# Patient Record
Sex: Male | Born: 1960
Health system: Southern US, Community
[De-identification: ages and names within clinical notes are randomized; demographics above are authoritative.]

## PROBLEM LIST (undated history)

## (undated) DIAGNOSIS — J339 Nasal polyp, unspecified: Secondary | ICD-10-CM

## (undated) DIAGNOSIS — N529 Male erectile dysfunction, unspecified: Secondary | ICD-10-CM

## (undated) DIAGNOSIS — I499 Cardiac arrhythmia, unspecified: Secondary | ICD-10-CM

## (undated) DIAGNOSIS — K219 Gastro-esophageal reflux disease without esophagitis: Secondary | ICD-10-CM

## (undated) DIAGNOSIS — J342 Deviated nasal septum: Secondary | ICD-10-CM

## (undated) DIAGNOSIS — Z86010 Personal history of colonic polyps: Secondary | ICD-10-CM

## (undated) DIAGNOSIS — I509 Heart failure, unspecified: Secondary | ICD-10-CM

## (undated) DIAGNOSIS — J329 Chronic sinusitis, unspecified: Secondary | ICD-10-CM

## (undated) DIAGNOSIS — I471 Supraventricular tachycardia: Secondary | ICD-10-CM

## (undated) DIAGNOSIS — T7840XA Allergy, unspecified, initial encounter: Secondary | ICD-10-CM

## (undated) DIAGNOSIS — E785 Hyperlipidemia, unspecified: Secondary | ICD-10-CM

## (undated) DIAGNOSIS — M1990 Unspecified osteoarthritis, unspecified site: Secondary | ICD-10-CM

## (undated) HISTORY — DX: Hyperlipidemia, unspecified: E78.5

## (undated) HISTORY — DX: Allergy, unspecified, initial encounter: T78.40XA

## (undated) HISTORY — PX: COLONOSCOPY: SHX174

## (undated) HISTORY — PX: OTHER SURGICAL HISTORY: SHX169

## (undated) HISTORY — DX: Supraventricular tachycardia: I47.1

## (undated) HISTORY — DX: Nasal polyp, unspecified: J33.9

## (undated) HISTORY — DX: Heart failure, unspecified: I50.9

## (undated) HISTORY — DX: Male erectile dysfunction, unspecified: N52.9

## (undated) HISTORY — PX: POLYPECTOMY: SHX149

## (undated) HISTORY — PX: COLONOSCOPY W/ BIOPSIES: SHX1374

## (undated) HISTORY — DX: Personal history of colonic polyps: Z86.010

## (undated) HISTORY — PX: UPPER GASTROINTESTINAL ENDOSCOPY: SHX188

## (undated) HISTORY — PX: SKIN GRAFT: SHX250

---

## 1997-09-04 DIAGNOSIS — I471 Supraventricular tachycardia, unspecified: Secondary | ICD-10-CM

## 1997-09-04 HISTORY — DX: Supraventricular tachycardia: I47.1

## 1997-09-04 HISTORY — PX: HAND SURGERY: SHX662

## 1997-09-04 HISTORY — DX: Supraventricular tachycardia, unspecified: I47.10

## 1998-01-14 ENCOUNTER — Inpatient Hospital Stay (HOSPITAL_COMMUNITY): Admission: AD | Admit: 1998-01-14 | Discharge: 1998-01-18 | Payer: Self-pay | Admitting: *Deleted

## 1998-01-28 ENCOUNTER — Emergency Department (HOSPITAL_COMMUNITY): Admission: EM | Admit: 1998-01-28 | Discharge: 1998-01-28 | Payer: Self-pay | Admitting: Emergency Medicine

## 2006-01-21 ENCOUNTER — Emergency Department (HOSPITAL_COMMUNITY): Admission: EM | Admit: 2006-01-21 | Discharge: 2006-01-21 | Payer: Self-pay | Admitting: Family Medicine

## 2007-06-28 ENCOUNTER — Ambulatory Visit (HOSPITAL_COMMUNITY): Admission: RE | Admit: 2007-06-28 | Discharge: 2007-06-28 | Payer: Self-pay | Admitting: Family Medicine

## 2009-12-02 ENCOUNTER — Encounter: Admission: RE | Admit: 2009-12-02 | Discharge: 2009-12-02 | Payer: Self-pay | Admitting: Obstetrics and Gynecology

## 2012-12-17 ENCOUNTER — Other Ambulatory Visit: Payer: Self-pay | Admitting: *Deleted

## 2012-12-17 ENCOUNTER — Telehealth: Payer: Self-pay | Admitting: Nurse Practitioner

## 2012-12-17 NOTE — Telephone Encounter (Signed)
RX WILL BE ESCRIBED BACK TODAY WITH DENIAL. NOT SEEN SINCE 1/13

## 2012-12-27 ENCOUNTER — Telehealth: Payer: Self-pay | Admitting: Nurse Practitioner

## 2012-12-28 NOTE — Telephone Encounter (Signed)
Denied, LMTCB, must be seen

## 2013-01-08 ENCOUNTER — Ambulatory Visit (INDEPENDENT_AMBULATORY_CARE_PROVIDER_SITE_OTHER): Payer: 59 | Admitting: Nurse Practitioner

## 2013-01-08 ENCOUNTER — Encounter: Payer: Self-pay | Admitting: Nurse Practitioner

## 2013-01-08 VITALS — BP 109/71 | HR 69 | Temp 97.5°F | Ht 65.5 in | Wt 214.0 lb

## 2013-01-08 DIAGNOSIS — J309 Allergic rhinitis, unspecified: Secondary | ICD-10-CM | POA: Insufficient documentation

## 2013-01-08 DIAGNOSIS — Z0000 Encounter for general adult medical examination without abnormal findings: Secondary | ICD-10-CM

## 2013-01-08 LAB — COMPLETE METABOLIC PANEL WITH GFR
Albumin: 4.1 g/dL (ref 3.5–5.2)
Chloride: 105 mEq/L (ref 96–112)
GFR, Est African American: >89 mL/min
Potassium: 4.2 mEq/L (ref 3.5–5.3)
Total Bilirubin: 0.4 mg/dL (ref 0.3–1.2)
Total Protein: 6.9 g/dL (ref 6.0–8.3)

## 2013-01-08 LAB — POCT CBC
HCT, POC: 43.1 % — AB (ref 43.5–53.7)
Hemoglobin: 14.4 g/dL (ref 14.1–18.1)
Lymph, poc: 2.6 (ref 0.6–3.4)
MCHC: 33.4 g/dL (ref 31.8–35.4)
MPV: 6.8 fL (ref 0–99.8)
POC Granulocyte: 3 (ref 2–6.9)
POC LYMPH PERCENT: 43.1 %L (ref 10–50)
RBC: 4.9 M/uL (ref 4.69–6.13)
WBC: 6.1 10*3/uL (ref 4.6–10.2)

## 2013-01-08 NOTE — Patient Instructions (Signed)
Health Maintenance, Males A healthy lifestyle and preventative care can promote health and wellness.  Maintain regular health, dental, and eye exams.  Eat a healthy diet. Foods like vegetables, fruits, whole grains, low-fat dairy products, and lean protein foods contain the nutrients you need without too many calories. Decrease your intake of foods high in solid fats, added sugars, and salt. Get information about a proper diet from your caregiver, if necessary.  Regular physical exercise is one of the most important things you can do for your health. Most adults should get at least 150 minutes of moderate-intensity exercise (any activity that increases your heart rate and causes you to sweat) each week. In addition, most adults need muscle-strengthening exercises on 2 or more days a week.   Maintain a healthy weight. The body mass index (BMI) is a screening tool to identify possible weight problems. It provides an estimate of body fat based on height and weight. Your caregiver can help determine your BMI, and can help you achieve or maintain a healthy weight. For adults 20 years and older:  A BMI below 18.5 is considered underweight.  A BMI of 18.5 to 24.9 is normal.  A BMI of 25 to 29.9 is considered overweight.  A BMI of 30 and above is considered obese.  Maintain normal blood lipids and cholesterol by exercising and minimizing your intake of saturated fat. Eat a balanced diet with plenty of fruits and vegetables. Blood tests for lipids and cholesterol should begin at age 20 and be repeated every 5 years. If your lipid or cholesterol levels are high, you are over 50, or you are a high risk for heart disease, you may need your cholesterol levels checked more frequently.Ongoing high lipid and cholesterol levels should be treated with medicines, if diet and exercise are not effective.  If you smoke, find out from your caregiver how to quit. If you do not use tobacco, do not start.  If you  choose to drink alcohol, do not exceed 2 drinks per day. One drink is considered to be 12 ounces (355 mL) of beer, 5 ounces (148 mL) of wine, or 1.5 ounces (44 mL) of liquor.  Avoid use of street drugs. Do not share needles with anyone. Ask for help if you need support or instructions about stopping the use of drugs.  High blood pressure causes heart disease and increases the risk of stroke. Blood pressure should be checked at least every 1 to 2 years. Ongoing high blood pressure should be treated with medicines if weight loss and exercise are not effective.  If you are 45 to 50 years old, ask your caregiver if you should take aspirin to prevent heart disease.  Diabetes screening involves taking a blood sample to check your fasting blood sugar level. This should be done once every 3 years, after age 45, if you are within normal weight and without risk factors for diabetes. Testing should be considered at a younger age or be carried out more frequently if you are overweight and have at least 1 risk factor for diabetes.  Colorectal cancer can be detected and often prevented. Most routine colorectal cancer screening begins at the age of 50 and continues through age 75. However, your caregiver may recommend screening at an earlier age if you have risk factors for colon cancer. On a yearly basis, your caregiver may provide home test kits to check for hidden blood in the stool. Use of a small camera at the end of a tube,   to directly examine the colon (sigmoidoscopy or colonoscopy), can detect the earliest forms of colorectal cancer. Talk to your caregiver about this at age 50, when routine screening begins. Direct examination of the colon should be repeated every 5 to 10 years through age 75, unless early forms of pre-cancerous polyps or small growths are found.  Hepatitis C blood testing is recommended for all people born from 1945 through 1965 and any individual with known risks for hepatitis C.  Healthy  men should no longer receive prostate-specific antigen (PSA) blood tests as part of routine cancer screening. Consult with your caregiver about prostate cancer screening.  Testicular cancer screening is not recommended for adolescents or adult males who have no symptoms. Screening includes self-exam, caregiver exam, and other screening tests. Consult with your caregiver about any symptoms you have or any concerns you have about testicular cancer.  Practice safe sex. Use condoms and avoid high-risk sexual practices to reduce the spread of sexually transmitted infections (STIs).  Use sunscreen with a sun protection factor (SPF) of 30 or greater. Apply sunscreen liberally and repeatedly throughout the day. You should seek shade when your shadow is shorter than you. Protect yourself by wearing long sleeves, pants, a wide-brimmed hat, and sunglasses year round, whenever you are outdoors.  Notify your caregiver of new moles or changes in moles, especially if there is a change in shape or color. Also notify your caregiver if a mole is larger than the size of a pencil eraser.  A one-time screening for abdominal aortic aneurysm (AAA) and surgical repair of large AAAs by sound wave imaging (ultrasonography) is recommended for ages 65 to 75 years who are current or former smokers.  Stay current with your immunizations. Document Released: 02/17/2008 Document Revised: 11/13/2011 Document Reviewed: 01/16/2011 [employer] Patient Information 2013 [employer].  

## 2013-01-08 NOTE — Progress Notes (Signed)
  Subjective:    Patient ID: Thomas Gates, male    DOB: [birth year], 52 y.o.   MRN: [id number]  HPI- Patient here today for CPE- He has no medical problems other than allergic rhinitis- Patient saw ENT several weeks ago and had nasal polyps- Was started on nasal spray and is doing much better. Patient has no Complaints today. Allergies  Allergen Reactions  . Penicillins Rash    Outpatient Encounter Prescriptions as of 01/08/2013  Medication Sig Dispense Refill  . fexofenadine (ALLEGRA) 180 MG tablet Take 180 mg by mouth daily.       No facility-administered encounter medications on file as of 01/08/2013.    Past Medical History  Diagnosis Date  . ED (erectile dysfunction)   . Hyperlipidemia     Past Surgical History  Procedure Laterality Date  . Svt ablation    . Skin graft      right hand    History   Social History  . Marital Status: Married    Spouse Name: N/A    Number of Children: N/A  . Years of Education: N/A   Occupational History  . Not on file.   Social History Main Topics  . Smoking status: Never Smoker   . Smokeless tobacco: Not on file  . Alcohol Use: No  . Drug Use: No  . Sexually Active: Not on file   Other Topics Concern  . Not on file   Social History Narrative  . No narrative on file         Review of Systems  All other systems reviewed and are negative.       Objective:   Physical Exam  Constitutional: He is oriented to person, place, and time. He appears well-developed and well-nourished.  HENT:  Head: Normocephalic.  Right Ear: External ear normal.  Left Ear: External ear normal.  Nose: Nose normal.  Mouth/Throat: Oropharynx is clear and moist.  Eyes: EOM are normal. Pupils are equal, round, and reactive to light.  Neck: Normal range of motion. Neck supple. No thyromegaly present.  Cardiovascular: Normal rate, regular rhythm, normal heart sounds and intact distal pulses.   No murmur heard. Pulmonary/Chest: Effort normal  and breath sounds normal. He has no wheezes. He has no rales.  Abdominal: Soft. Bowel sounds are normal.  Genitourinary: Prostate normal and penis normal.  Negative Hemocult  Musculoskeletal: Normal range of motion.  Neurological: He is alert and oriented to person, place, and time.  Skin: Skin is warm and dry.  Psychiatric: He has a normal mood and affect. His behavior is normal. Judgment and thought content normal.   BP 109/71  Pulse 69  Temp(Src) 97.5 F (36.4 C) (Oral)  Ht 5' 5.5" (1.664 m)  Wt 214 lb (97.07 kg)  BMI 35.06 kg/m2       Assessment & Plan:  CPE  Low fat diet and exercise  Labs pending Mary-Margaret Daphine Deutscher, FNP

## 2013-01-10 LAB — NMR LIPOPROFILE WITH LIPIDS
Cholesterol, Total: 196 mg/dL (ref <200)
HDL Particle Number: 28.8 umol/L — ABNORMAL LOW (ref >=30.5)
HDL Size: 9.4 nm (ref >=9.2)
HDL-C: 51 mg/dL (ref >=40)
LDL (calc): 122 mg/dL — ABNORMAL HIGH (ref <100)
LDL Particle Number: 1423 nmol/L — ABNORMAL HIGH (ref <1000)
LDL Size: 21.3 nm (ref >20.5)
LP-IR Score: <25 (ref <=45)

## 2013-02-10 ENCOUNTER — Other Ambulatory Visit: Payer: Self-pay | Admitting: Nurse Practitioner

## 2013-05-06 ENCOUNTER — Encounter (HOSPITAL_BASED_OUTPATIENT_CLINIC_OR_DEPARTMENT_OTHER): Payer: Self-pay | Admitting: *Deleted

## 2013-05-09 ENCOUNTER — Encounter (HOSPITAL_BASED_OUTPATIENT_CLINIC_OR_DEPARTMENT_OTHER): Payer: Self-pay | Admitting: *Deleted

## 2013-05-09 ENCOUNTER — Ambulatory Visit (HOSPITAL_BASED_OUTPATIENT_CLINIC_OR_DEPARTMENT_OTHER): Payer: 59 | Admitting: Anesthesiology

## 2013-05-09 ENCOUNTER — Ambulatory Visit (HOSPITAL_BASED_OUTPATIENT_CLINIC_OR_DEPARTMENT_OTHER)
Admission: RE | Admit: 2013-05-09 | Discharge: 2013-05-09 | Disposition: A | Discharge status: Home / Self Care | Payer: 59 | Source: Ambulatory Visit | Attending: Otolaryngology | Admitting: Otolaryngology

## 2013-05-09 ENCOUNTER — Encounter (HOSPITAL_BASED_OUTPATIENT_CLINIC_OR_DEPARTMENT_OTHER)
Admission: RE | Disposition: A | Discharge status: Home / Self Care | Payer: Self-pay | Source: Ambulatory Visit | Attending: Otolaryngology

## 2013-05-09 ENCOUNTER — Encounter (HOSPITAL_BASED_OUTPATIENT_CLINIC_OR_DEPARTMENT_OTHER): Payer: Self-pay | Admitting: Anesthesiology

## 2013-05-09 DIAGNOSIS — J342 Deviated nasal septum: Secondary | ICD-10-CM | POA: Insufficient documentation

## 2013-05-09 DIAGNOSIS — M19049 Primary osteoarthritis, unspecified hand: Secondary | ICD-10-CM | POA: Insufficient documentation

## 2013-05-09 DIAGNOSIS — Z79899 Other long term (current) drug therapy: Secondary | ICD-10-CM | POA: Insufficient documentation

## 2013-05-09 DIAGNOSIS — J329 Chronic sinusitis, unspecified: Secondary | ICD-10-CM | POA: Diagnosis present

## 2013-05-09 DIAGNOSIS — J322 Chronic ethmoidal sinusitis: Secondary | ICD-10-CM | POA: Insufficient documentation

## 2013-05-09 DIAGNOSIS — J324 Chronic pansinusitis: Secondary | ICD-10-CM | POA: Diagnosis present

## 2013-05-09 DIAGNOSIS — K219 Gastro-esophageal reflux disease without esophagitis: Secondary | ICD-10-CM | POA: Insufficient documentation

## 2013-05-09 DIAGNOSIS — N529 Male erectile dysfunction, unspecified: Secondary | ICD-10-CM | POA: Insufficient documentation

## 2013-05-09 DIAGNOSIS — J323 Chronic sphenoidal sinusitis: Secondary | ICD-10-CM | POA: Insufficient documentation

## 2013-05-09 DIAGNOSIS — I498 Other specified cardiac arrhythmias: Secondary | ICD-10-CM | POA: Insufficient documentation

## 2013-05-09 DIAGNOSIS — E785 Hyperlipidemia, unspecified: Secondary | ICD-10-CM | POA: Insufficient documentation

## 2013-05-09 DIAGNOSIS — J3489 Other specified disorders of nose and nasal sinuses: Secondary | ICD-10-CM | POA: Insufficient documentation

## 2013-05-09 DIAGNOSIS — J320 Chronic maxillary sinusitis: Secondary | ICD-10-CM | POA: Insufficient documentation

## 2013-05-09 DIAGNOSIS — Z880 Allergy status to penicillin: Secondary | ICD-10-CM | POA: Insufficient documentation

## 2013-05-09 HISTORY — DX: Chronic sinusitis, unspecified: J32.9

## 2013-05-09 HISTORY — DX: Deviated nasal septum: J34.2

## 2013-05-09 HISTORY — DX: Unspecified osteoarthritis, unspecified site: M19.90

## 2013-05-09 HISTORY — DX: Gastro-esophageal reflux disease without esophagitis: K21.9

## 2013-05-09 HISTORY — PX: SINUS ENDO W/FUSION: SHX777

## 2013-05-09 HISTORY — PX: NASAL SEPTOPLASTY W/ TURBINOPLASTY: SHX2070

## 2013-05-09 HISTORY — DX: Cardiac arrhythmia, unspecified: I49.9

## 2013-05-09 LAB — POCT HEMOGLOBIN-HEMACUE: Hemoglobin: 14.1 g/dL (ref 13.0–17.0)

## 2013-05-09 SURGERY — SEPTOPLASTY, NOSE, WITH NASAL TURBINATE REDUCTION
Anesthesia: General | Site: Nose | Laterality: Bilateral | Wound class: Clean Contaminated

## 2013-05-09 MED ORDER — FENTANYL CITRATE 0.05 MG/ML IJ SOLN
INTRAMUSCULAR | Status: DC | PRN
  Administered 2013-05-09: 50 ug via INTRAVENOUS
  Administered 2013-05-09 (×2): 100 ug via INTRAVENOUS

## 2013-05-09 MED ORDER — MIDAZOLAM HCL 5 MG/5ML IJ SOLN
INTRAMUSCULAR | Status: DC | PRN
  Administered 2013-05-09: 2 mg via INTRAVENOUS

## 2013-05-09 MED ORDER — ROCURONIUM BROMIDE 100 MG/10ML IV SOLN
INTRAVENOUS | Status: DC | PRN
  Administered 2013-05-09: 40 mg via INTRAVENOUS

## 2013-05-09 MED ORDER — DEXAMETHASONE SODIUM PHOSPHATE 4 MG/ML IJ SOLN
INTRAMUSCULAR | Status: DC | PRN
  Administered 2013-05-09: 10 mg via INTRAVENOUS

## 2013-05-09 MED ORDER — OXYMETAZOLINE HCL 0.05 % NA SOLN
NASAL | Status: DC | PRN
  Administered 2013-05-09: 1 via NASAL

## 2013-05-09 MED ORDER — ONDANSETRON HCL 4 MG/2ML IJ SOLN: 4.0000 mg | Freq: Once | INTRAMUSCULAR | Status: DC | PRN

## 2013-05-09 MED ORDER — OXYCODONE HCL 5 MG PO TABS
5.0000 mg | ORAL_TABLET | Freq: Once | ORAL | Status: AC | PRN
  Administered 2013-05-09: 5 mg via ORAL

## 2013-05-09 MED ORDER — TRIAMCINOLONE ACETONIDE 40 MG/ML IJ SUSP
INTRAMUSCULAR | Status: DC | PRN
  Administered 2013-05-09: 40 mg via INTRAMUSCULAR

## 2013-05-09 MED ORDER — SUCCINYLCHOLINE CHLORIDE 20 MG/ML IJ SOLN
INTRAMUSCULAR | Status: DC | PRN
  Administered 2013-05-09: 100 mg via INTRAVENOUS

## 2013-05-09 MED ORDER — OXYCODONE HCL 5 MG/5ML PO SOLN: 5.0000 mg | Freq: Once | ORAL | Status: AC | PRN

## 2013-05-09 MED ORDER — NEOSTIGMINE METHYLSULFATE 1 MG/ML IJ SOLN
INTRAMUSCULAR | Status: DC | PRN
  Administered 2013-05-09: 3 mg via INTRAVENOUS

## 2013-05-09 MED ORDER — ONDANSETRON HCL 4 MG/2ML IJ SOLN
INTRAMUSCULAR | Status: DC | PRN
  Administered 2013-05-09: 4 mg via INTRAVENOUS

## 2013-05-09 MED ORDER — GLYCOPYRROLATE 0.2 MG/ML IJ SOLN
INTRAMUSCULAR | Status: DC | PRN
  Administered 2013-05-09: 0.4 mg via INTRAVENOUS

## 2013-05-09 MED ORDER — HYDROMORPHONE HCL PF 1 MG/ML IJ SOLN
0.2500 mg | INTRAMUSCULAR | Status: DC | PRN
  Administered 2013-05-09 (×2): 0.25 mg via INTRAVENOUS
  Administered 2013-05-09: 0.5 mg via INTRAVENOUS
  Administered 2013-05-09: 0.25 mg via INTRAVENOUS

## 2013-05-09 MED ORDER — FENTANYL CITRATE 0.05 MG/ML IJ SOLN: 50.0000 ug | INTRAMUSCULAR | Status: DC | PRN

## 2013-05-09 MED ORDER — LIDOCAINE-EPINEPHRINE 1 %-1:100000 IJ SOLN
INTRAMUSCULAR | Status: DC | PRN
  Administered 2013-05-09: 8 mL

## 2013-05-09 MED ORDER — LACTATED RINGERS IV SOLN
INTRAVENOUS | Status: DC
  Administered 2013-05-09: 09:00:00 via INTRAVENOUS
  Administered 2013-05-09: 20 mL/h via INTRAVENOUS
  Administered 2013-05-09: 07:00:00 via INTRAVENOUS

## 2013-05-09 MED ORDER — CIPROFLOXACIN IN D5W 400 MG/200ML IV SOLN
INTRAVENOUS | Status: DC | PRN
  Administered 2013-05-09: 400 mg via INTRAVENOUS

## 2013-05-09 MED ORDER — MUPIROCIN CALCIUM 2 % EX CREA
TOPICAL_CREAM | CUTANEOUS | Status: DC | PRN
  Administered 2013-05-09: 1 via TOPICAL

## 2013-05-09 MED ORDER — MIDAZOLAM HCL 2 MG/2ML IJ SOLN: 1.0000 mg | INTRAMUSCULAR | Status: DC | PRN

## 2013-05-09 MED ORDER — MOXIFLOXACIN HCL 400 MG PO TABS: 400.0000 mg | ORAL_TABLET | Freq: Every day | ORAL | Status: DC

## 2013-05-09 MED ORDER — HYDROCODONE-ACETAMINOPHEN 5-325 MG PO TABS: 1.0000 | ORAL_TABLET | Freq: Four times a day (QID) | ORAL | Status: DC | PRN

## 2013-05-09 MED ORDER — PROPOFOL 10 MG/ML IV BOLUS
INTRAVENOUS | Status: DC | PRN
  Administered 2013-05-09: 200 mg via INTRAVENOUS

## 2013-05-09 SURGICAL SUPPLY — 61 items
ATTRACTOMAT 16X20 MAGNETIC DRP (DRAPES) IMPLANT
BLADE RAD40 ROTATE 4M 4 5PK (BLADE) IMPLANT
BLADE RAD60 ROTATE M4 4 5PK (BLADE) IMPLANT
BLADE ROTATE RAD 12 4 M4 (BLADE) IMPLANT
BLADE ROTATE RAD 40 4 M4 (BLADE) ×2 IMPLANT
BLADE ROTATE RAD12 5PK M4 4MM (BLADE) IMPLANT
BLADE ROTATE TRICUT 4X13 M4 (BLADE) ×2 IMPLANT
BLADE SURG 15 STRL LF DISP TIS (BLADE) IMPLANT
BLADE SURG 15 STRL SS (BLADE)
BLADE TRICUT ROTATE M4 4 5PK (BLADE) ×2 IMPLANT
BUR HS RAD FRONTAL 3 (BURR) IMPLANT
CANISTER SUC SOCK COL 7 IN (MISCELLANEOUS) ×2 IMPLANT
CANISTER SUCTION 1200CC (MISCELLANEOUS) ×4 IMPLANT
CLOTH BEACON ORANGE TIMEOUT ST (SAFETY) ×2 IMPLANT
COAGULATOR SUCT 8FR VV (MISCELLANEOUS) IMPLANT
COAGULATOR SUCT SWTCH 10FR 6 (ELECTROSURGICAL) IMPLANT
DECANTER SPIKE VIAL GLASS SM (MISCELLANEOUS) IMPLANT
DRAPE SURG 17X23 STRL (DRAPES) ×2 IMPLANT
DRESSING NASAL KENNEDY 3.5X.9 (MISCELLANEOUS) ×1 IMPLANT
DRSG NASAL KENNEDY 3.5X.9 (MISCELLANEOUS) ×2
DRSG NASOPORE 8CM (GAUZE/BANDAGES/DRESSINGS) ×2 IMPLANT
DRSG TELFA 3X8 NADH (GAUZE/BANDAGES/DRESSINGS) IMPLANT
ELECT COATED BLADE 2.86 ST (ELECTRODE) IMPLANT
ELECT REM PT RETURN 9FT ADLT (ELECTROSURGICAL) ×2
ELECTRODE REM PT RTRN 9FT ADLT (ELECTROSURGICAL) ×1 IMPLANT
GLOVE BIOGEL M 7.0 STRL (GLOVE) ×4 IMPLANT
GLOVE SURG SS PI 7.0 STRL IVOR (GLOVE) ×2 IMPLANT
GOWN PREVENTION PLUS XLARGE (GOWN DISPOSABLE) ×4 IMPLANT
HANDPIECE HYDRODEBRIDER FRONT (BLADE) IMPLANT
IV NS 1000ML (IV SOLUTION)
IV NS 1000ML BAXH (IV SOLUTION) IMPLANT
IV NS 500ML (IV SOLUTION) ×1
IV NS 500ML BAXH (IV SOLUTION) ×1 IMPLANT
NDL SAFETY ECLIPSE 18X1.5 (NEEDLE) ×1 IMPLANT
NEEDLE 27GAX1X1/2 (NEEDLE) ×2 IMPLANT
NEEDLE HYPO 18GX1.5 SHARP (NEEDLE) ×1
NS IRRIG 1000ML POUR BTL (IV SOLUTION) IMPLANT
PACK BASIN DAY SURGERY FS (CUSTOM PROCEDURE TRAY) ×2 IMPLANT
PACK ENT DAY SURGERY (CUSTOM PROCEDURE TRAY) ×2 IMPLANT
PAD ENT ADHESIVE 25PK (MISCELLANEOUS) ×2 IMPLANT
PENCIL BUTTON HOLSTER BLD 10FT (ELECTRODE) IMPLANT
SET EXT MALE ROTATING LL 32IN (MISCELLANEOUS) ×2 IMPLANT
SLEEVE SCD COMPRESS KNEE MED (MISCELLANEOUS) ×2 IMPLANT
SOLUTION BUTLER CLEAR DIP (MISCELLANEOUS) ×2 IMPLANT
SPLINT NASAL DOYLE BI-VL (GAUZE/BANDAGES/DRESSINGS) ×2 IMPLANT
SPONGE GAUZE 2X2 8PLY STRL LF (GAUZE/BANDAGES/DRESSINGS) ×2 IMPLANT
SPONGE NEURO XRAY DETECT 1X3 (DISPOSABLE) ×2 IMPLANT
SPONGE SURGIFOAM ABS GEL 12-7 (HEMOSTASIS) IMPLANT
SUT ETHILON 3 0 PS 1 (SUTURE) ×2 IMPLANT
SUT PLAIN 4 0 ~~LOC~~ 1 (SUTURE) ×2 IMPLANT
SUT SILK 3 0 PS 1 (SUTURE) IMPLANT
SYR 3ML 23GX1 SAFETY (SYRINGE) IMPLANT
SYSTEM HYDRODEBRIDER (MISCELLANEOUS) IMPLANT
TOWEL OR 17X24 6PK STRL BLUE (TOWEL DISPOSABLE) ×4 IMPLANT
TRACKER ENT INSTRUMENT (MISCELLANEOUS) ×2 IMPLANT
TRACKER ENT PATIENT (MISCELLANEOUS) ×2 IMPLANT
TUBE CONNECTING 20X1/4 (TUBING) ×2 IMPLANT
TUBE SALEM SUMP 12R W/ARV (TUBING) IMPLANT
TUBE SALEM SUMP 16 FR W/ARV (TUBING) ×2 IMPLANT
TUBING STRAIGHTSHOT EPS 5PK (TUBING) ×2 IMPLANT
YANKAUER SUCT BULB TIP NO VENT (SUCTIONS) ×2 IMPLANT

## 2013-05-09 NOTE — Transfer of Care (Signed)
Immediate Anesthesia Transfer of Care Note  Patient: Thomas Gates  Procedure(s) Performed: Procedure(s): NASAL SEPTOPLASTY AND BILATERAL INFERIOR TURBINATE REDUCTION,  (Bilateral) BILATERAL ENDOSCOPIC SINUS SURGERY WITH FUSION NAVIGATION (Bilateral)  Patient Location: PACU  Anesthesia Type:General  Level of Consciousness: awake, alert  and oriented  Airway & Oxygen Therapy: Patient Spontanous Breathing and Patient connected to face mask oxygen  Post-op Assessment: Report given to PACU RN and Post -op Vital signs reviewed and stable  Post vital signs: Reviewed and stable  Complications: No apparent anesthesia complications

## 2013-05-09 NOTE — Anesthesia Procedure Notes (Signed)
Procedure Name: Intubation Date/Time: 05/09/2013 7:40 AM Performed by: Burna Cash Pre-anesthesia Checklist: Patient identified, Emergency Drugs available, Suction available and Patient being monitored Patient Re-evaluated:Patient Re-evaluated prior to inductionOxygen Delivery Method: Circle System Utilized Preoxygenation: Pre-oxygenation with 100% oxygen Intubation Type: IV induction Ventilation: Mask ventilation without difficulty Grade View: Grade I Tube type: Oral Tube size: 8.0 mm Number of attempts: 1 Airway Equipment and Method: stylet and oral airway Placement Confirmation: ETT inserted through vocal cords under direct vision,  positive ETCO2 and breath sounds checked- equal and bilateral Secured at: 21 cm Tube secured with: Tape Dental Injury: Teeth and Oropharynx as per pre-operative assessment

## 2013-05-09 NOTE — Brief Op Note (Signed)
05/09/2013  9:54 AM  PATIENT:  Thomas Gates  52 y.o. male  PRE-OPERATIVE DIAGNOSIS:  DEVIATED SPETUM ;CHRONIC SINUSITIS  POST-OPERATIVE DIAGNOSIS:  DEVIATED SPETUM;CHRONIC CHRONIC   PROCEDURE:  Procedure(s): NASAL SEPTOPLASTY AND BILATERAL INFERIOR TURBINATE REDUCTION,  (Bilateral) BILATERAL ENDOSCOPIC SINUS SURGERY WITH FUSION NAVIGATION (Bilateral)  SURGEON:  Surgeon(s) and Role:    * Osborn Coho, MD - Primary  PHYSICIAN ASSISTANT:   ASSISTANTS: none   ANESTHESIA:   general  EBL:  Total I/O In: 1500 [I.V.:1500] Out: - 300  BLOOD ADMINISTERED:none  DRAINS: none   LOCAL MEDICATIONS USED:  LIDOCAINE  and Amount: 9 ml  SPECIMEN:  Source of Specimen:  Bil sinus contents  DISPOSITION OF SPECIMEN:  PATHOLOGY  COUNTS:  YES  TOURNIQUET:  * No tourniquets in log *  DICTATION: .Other Dictation: Dictation Number [id number]  PLAN OF CARE: Discharge to home after PACU  PATIENT DISPOSITION:  PACU - hemodynamically stable.   Delay start of Pharmacological VTE agent (>24hrs) due to surgical blood loss or risk of bleeding: not applicable

## 2013-05-09 NOTE — Anesthesia Postprocedure Evaluation (Signed)
  Anesthesia Post-op Note  Patient: Thomas Gates  Procedure(s) Performed: Procedure(s): NASAL SEPTOPLASTY AND BILATERAL INFERIOR TURBINATE REDUCTION,  (Bilateral) BILATERAL ENDOSCOPIC SINUS SURGERY WITH FUSION NAVIGATION (Bilateral)  Patient Location: PACU  Anesthesia Type:General  Level of Consciousness: awake, alert  and oriented  Airway and Oxygen Therapy: Patient Spontanous Breathing and Patient connected to nasal cannula oxygen  Post-op Pain: mild  Post-op Assessment: Post-op Vital signs reviewed, Patient's Cardiovascular Status Stable, Respiratory Function Stable, Patent Airway and Pain level controlled  Post-op Vital Signs: stable  Complications: No apparent anesthesia complications

## 2013-05-09 NOTE — H&P (Signed)
Thomas Gates is an 52 y.o. male.   Chief Complaint: Nasal obstruction and Sinusitis HPI: Chronic progressive nasal obstruction, chronic sinusitis  Past Medical History  Diagnosis Date  . ED (erectile dysfunction)   . Hyperlipidemia   . Deviated septum   . Chronic sinusitis   . GERD (gastroesophageal reflux disease)   . Arthritis     rt hand  . Dysrhythmia     SVT    Past Surgical History  Procedure Laterality Date  . Svt ablation    . Skin graft      right hand  . Hand surgery  1999    Family History  Problem Relation Age of Onset  . Heart attack Mother   . COPD Mother   . Emphysema Father    Social History:  reports that he has never smoked. He does not have any smokeless tobacco history on file. He reports that he does not drink alcohol or use illicit drugs.  Allergies:  Allergies  Allergen Reactions  . Penicillins Shortness Of Breath    Rash also    Medications Prior to Admission  Medication Sig Dispense Refill  . cetirizine (ZYRTEC) 10 MG tablet Take 10 mg by mouth daily.      . fish oil-omega-3 fatty acids 1000 MG capsule Take 2 g by mouth daily.      Marland Kitchen omeprazole (PRILOSEC) 20 MG capsule Take 20 mg by mouth daily.      Marland Kitchen CIALIS 20 MG tablet TAKE ONE TABLET BY MOUTH AS NEEDED  9 tablet  2    Results for orders placed during the hospital encounter of 05/09/13 (from the past 48 hour(s))  POCT HEMOGLOBIN-HEMACUE     Status: None   Collection Time    05/09/13  6:48 AM      Result Value Range   Hemoglobin 14.1  13.0 - 17.0 g/dL   No results found.  Review of Systems  Constitutional: Negative.   HENT: Negative.   Cardiovascular: Negative.   Gastrointestinal: Negative.   Neurological: Negative.     Blood pressure 120/81, pulse 53, temperature 97.4 F (36.3 C), temperature source Oral, resp. rate 18, height 5\' 5"  (1.651 m), weight 100.302 kg (221 lb 2 oz), SpO2 99.00%. Physical Exam  Constitutional: He is oriented to person, place, and time. He  appears well-developed and well-nourished.  HENT:  Nose: Septal deviation present.  Neck: Normal range of motion. Neck supple.  Cardiovascular: Normal rate.   Respiratory: Effort normal.  GI: Soft.  Musculoskeletal: Normal range of motion.  Neurological: He is alert and oriented to person, place, and time.     Assessment/Plan Adm for OP sinonasal surgery under GA,  Thomas Gates 05/09/2013, 7:39 AM

## 2013-05-09 NOTE — Anesthesia Preprocedure Evaluation (Addendum)
Anesthesia Evaluation  Patient identified by MRN, date of birth, ID band Patient awake    Reviewed: Allergy & Precautions, H&P , NPO status , Patient's Chart, lab work & pertinent test results  History of Anesthesia Complications Negative for: history of anesthetic complications  Airway       Dental   Pulmonary neg pulmonary ROS,          Cardiovascular     Neuro/Psych negative neurological ROS     GI/Hepatic   Endo/Other    Renal/GU      Musculoskeletal   Abdominal   Peds  Hematology   Anesthesia Other Findings   Reproductive/Obstetrics                           Anesthesia Physical Anesthesia Plan Anesthesia Quick Evaluation

## 2013-05-12 ENCOUNTER — Encounter (HOSPITAL_BASED_OUTPATIENT_CLINIC_OR_DEPARTMENT_OTHER): Payer: Self-pay | Admitting: Otolaryngology

## 2013-05-12 NOTE — Op Note (Signed)
Thomas Gates, Thomas Gates              ACCOUNT NO.:  [id number]  MEDICAL RECORD NO.:  [id number]  LOCATION:                               FACILITY:  [facility]  PHYSICIAN:  Kinnie Scales. Annalee Genta, M.D.DATE OF BIRTH:  [birth year]  DATE OF PROCEDURE:  05/09/2013 DATE OF DISCHARGE:  05/09/2013                              OPERATIVE REPORT   PREOPERATIVE DIAGNOSES: 1. Chronic sinusitis with nasal polyposis. 2. Deviated nasal septum with airway obstruction. 3. Inferior turbinate hypertrophy.  POSTOPERATIVE DIAGNOSES: 1. Chronic sinusitis with nasal polyposis. 2. Deviated nasal septum with airway obstruction. 3. Inferior turbinate hypertrophy.  INDICATION FOR SURGERY: 1. Chronic sinusitis with nasal polyposis. 2. Deviated nasal septum with airway obstruction. 3. Inferior turbinate hypertrophy.  PROCEDURE: 1. Bilateral endoscopic sinus surgery with intraoperative computer-     assisted navigation (fusion consisting of bilateral maxillary     antrostomy with removal of diseased tissue, bilateral total     ethmoidectomy, bilateral sphenoidotomy with removal of diseased     tissue and left nasal frontal recess exploration). 2. Nasal septoplasty. 3. Bilateral inferior turbinate reduction.  ANESTHESIA:  General endotracheal.  SURGEON:  Kinnie Scales. Annalee Genta, M.D.  COMPLICATIONS:  None.  ESTIMATED BLOOD LOSS:  Approximately 300 mL.  The patient transferred from the operating room to the recovery room in stable condition.  There were no complications.  FINDINGS:  Extensive bilateral nasal polyposis involving all sinuses with opacification and obstruction of the sinus ostia, deviated nasal septum with airway obstruction, and significant turbinate hypertrophy. At the conclusion of the procedure Bactroban/Kenalog slurry was instilled in all sinuses, bilateral Kennedy sinus packs were placed, bilateral Doyle nasal septal splints were placed.  BRIEF HISTORY:  The patient is a 52 year old white male,  referred to our office for evaluation of progressive symptoms of nasal airway obstruction, chronic sinusitis, and increasing difficulty with nasal airway and breathing.  Examination in the office revealed a severely deviated nasal septum and turbinate hypertrophy.  The patient had been on approximately 6 courses of antibiotics prior to presentation.  He was retreated with antibiotics oral and topical steroids and saline irrigation.  At the conclusion of this medical therapy, a CT scan of the sinuses was obtained which showed near-complete opacification of all sinuses, a hypoplastic right frontal sinus,  severely deviated nasal septum, and turbinate hypertrophy.  Given his history, examination, and failure to respond to appropriate medical therapy, I recommended the above surgical procedures.  The risks and benefits of these procedures were discussed in detail with the patient and his wife and they understood and concurred with our plan for surgery which is scheduled as an outpatient under general anesthesia at [facility].  DESCRIPTION OF PROCEDURE:  The patient was brought to the operating room, placed in supine position on the operating table.  General endotracheal anesthesia was established without difficulty.  When the patient adequately anesthetized, he was positioned on the operating table, prepped and draped in a sterile fashion.  His nose was injected with a total of 9 mL of 1% lidocaine with 1:100,000 solution of epinephrine.  We injected in a submucosal fashion along the lateral nasal wall, middle turbinate, intranasal polyps, nasal septum, and  inferior turbinates bilaterally.  The patient's nose was then packed with Afrin-soaked cottonoid pledgets and were left in place for approximately 10 minutes to allow for vasoconstriction hemostasis.  The Xomed fusion head gear was placed and anatomic and surgical landmarks were identified and confirmed.  The  computer navigation tool was used throughout the sinus aspect of the surgical procedure.  The patient prepared for surgery.  A 0-degree nasal endoscopy was performed began on the left-hand side.  The middle turbinate was carefully medialized and heavy polypoid disease was resected from within the middle meatus using a straight microdebrider.  A total ethmoidectomy was then performed after resection of the ethmoid bulla.  Dissection was carried from anterior to posterior along the ethmoid left ethmoid region removing bony septations and polypoid diseased mucosa.  The posterior- superior aspect of the sinus was then identified and a 45-degree telescope, curved microdebrider, and navigation tool was used to dissect from posterior to anterior.  The nasal frontal recess was identified and this was completely occluded with underlying polypoid disease and anterior ethmoid cells.  These were resected and the natural ostium of the frontal sinus was identified.  Polypoid material was resected anteriorly and the nasal frontal recess was widely patent at the conclusion of the procedure.  Attention was turned to lateral nasal wall with the uncinate process was resected.  The natural ostium of the maxillary sinus was identified.  There was a large posterior accessory ostium with polypoid disease including both using the curved microdebrider.  The left maxillary sinus was cleared of polyps and diseased mucosa.  The left sphenoid sinus was then accessed through the posterior ethmoid region.  The natural ostium was identified and enlarged laterally and inferiorly.  Polypoid material from within the left sphenoid sinus was resected and the sinus ostium was patent at the conclusion of the procedure.  Nasal septoplasty was then performed.  A left anterior hemitransfixion incision was created.  A mucoperichondrial flap was elevated from anterior to posterior on the left-hand side.  A cartilaginous septum  was crossed at the midline.  The mucoperichondrial flap elevated on the right.  Deviated bone and cartilage in the mid and posterior aspects of nasal septum were then mobilized.  There was a large bony septal spur which was removed using through cutting osteotome.  The septum was brought to good midline position.  At the conclusion of the procedure, the mid septal cartilage was morselized and returned to the mucoperichondrial pocket and the flaps were reapproximated with a 4-0 gut suture on a Keith needle in a horizontal mattress fashion. Bilateral Doyle nasal septal splints were placed and after the application of Bactroban ointment, and were sutured in position with a 3- 0 Ethilon suture.  The patient's right endoscopic sinus surgery was then undertaken.  Using a 0-degree telescope, the middle turbinate was medialized and the middle meatus was explored.  There was heavy polypoid disease which was resected with a straight microdebrider and a total ethmoidectomy was then performed from anterior to posterior along the inferior component of the ethmoid sinus.  Posterior-superior cells were identified, dissection was then carried from posterior to anterior.  The patient had a hypoplastic right frontal sinus and nasal frontal recess exploration was not undertaken.  The polypoid material from the anterior-superior ethmoid cells was completely cleared.  Attention was turned to the lateral nasal wall, where again the uncinate process was completely resected with a curved microdebrider.  The patient had large accessory ostium and the intervening soft tissue  was removed with polypoid material within the sinus resected with a curved microdebrider creating widely patent sinus.  The right sphenoid sinus was then addressed.  The natural ostium was identified.  It was occluded with polypoid disease. Using a 0-degree telescope and a straight microdebrider, the polyps were resected and the natural  ostium was enlarged in an inferior medial direction.  Inferior turbinate reduction was performed with cautery set at 12 watts, 2 submucosal passes were made with intramural cautery.  When the turbinates were adequately cauterized, anterior incisions were created, overlying soft tissue elevated and a small amount of turbinate bone was resected.  The turbinates were then outfractured creating more patent nasal cavity.  Surgical procedure was cleared.  The sinuses were inspected and surgical debris and clots were cleared from all sinuses.  A 50:50 mix of Kenalog 40 and Bactroban cream was then instilled in the sinuses and bilateral Kennedy sinus packs were placed in the middle meatus.  The patient's oral cavity was suctioned, orogastric tube was passed.  Stomach contents were then aspirated.  The patient was awakened from his anesthetic.  He was extubated and transferred from the operating room to the recovery room in stable condition.  There were no complications.  Estimated blood loss was approximately 300 mL.          ______________________________ Kinnie Scales. Annalee Genta, M.D.     DLS/MEDQ  D:  19/14/7829  T:  05/09/2013  Job:  [id number]

## 2013-09-10 ENCOUNTER — Ambulatory Visit (INDEPENDENT_AMBULATORY_CARE_PROVIDER_SITE_OTHER): Payer: 59 | Admitting: General Practice

## 2013-09-10 ENCOUNTER — Encounter: Payer: Self-pay | Admitting: General Practice

## 2013-09-10 VITALS — BP 144/88 | HR 101 | Temp 101.1°F | Ht 65.0 in | Wt 232.5 lb

## 2013-09-10 DIAGNOSIS — R059 Cough, unspecified: Secondary | ICD-10-CM

## 2013-09-10 DIAGNOSIS — J988 Other specified respiratory disorders: Secondary | ICD-10-CM

## 2013-09-10 DIAGNOSIS — R05 Cough: Secondary | ICD-10-CM

## 2013-09-10 DIAGNOSIS — R509 Fever, unspecified: Secondary | ICD-10-CM

## 2013-09-10 LAB — POCT INFLUENZA A/B
INFLUENZA B, POC: NEGATIVE
Influenza A, POC: NEGATIVE

## 2013-09-10 LAB — POCT RAPID STREP A (OFFICE): RAPID STREP A SCREEN: NEGATIVE

## 2013-09-10 MED ORDER — AZITHROMYCIN 250 MG PO TABS: ORAL_TABLET | ORAL | Status: DC

## 2013-09-10 MED ORDER — GUAIFENESIN-CODEINE 100-10 MG/5ML PO SYRP: 5.0000 mL | ORAL_SOLUTION | Freq: Three times a day (TID) | ORAL | Status: DC | PRN

## 2013-09-10 NOTE — Progress Notes (Signed)
   Subjective:    Patient ID: Thomas Gates, male    DOB: 03-21-1961, 53 y.o.   MRN: [id number]  Fever  This is a new problem. The current episode started yesterday. The problem occurs daily. The problem has been unchanged. His temperature was unmeasured prior to arrival. Associated symptoms include congestion and coughing. Pertinent negatives include no chest pain, diarrhea or wheezing. He has tried NSAIDs for the symptoms. The treatment provided mild relief.  Cough This is a new problem. The current episode started in the past 7 days. The problem has been gradually worsening. The cough is productive of sputum (thick dark yellow sputum). Associated symptoms include chills, a fever and nasal congestion. Pertinent negatives include no chest pain, postnasal drip, shortness of breath or wheezing. The symptoms are aggravated by lying down. He has tried OTC cough suppressant for the symptoms. There is no history of asthma, bronchitis or pneumonia.      Review of Systems  Constitutional: Positive for fever and chills.  HENT: Positive for congestion and sinus pressure. Negative for postnasal drip.   Respiratory: Positive for cough. Negative for chest tightness, shortness of breath and wheezing.   Cardiovascular: Negative for chest pain and palpitations.  Gastrointestinal: Negative for diarrhea.       Objective:   Physical Exam  Constitutional: He is oriented to person, place, and time. He appears well-developed and well-nourished.  Cardiovascular: Normal rate and normal heart sounds.   Pulmonary/Chest: Effort normal.  Neurological: He is alert and oriented to person, place, and time.  Skin: Skin is warm and dry.  Psychiatric: He has a normal mood and affect.    Results for orders placed in visit on 09/10/13  POCT INFLUENZA A/B      Result Value Range   Influenza A, POC Negative     Influenza B, POC Negative    POCT RAPID STREP A (OFFICE)      Result Value Range   Rapid Strep A Screen  Negative  Negative        Assessment & Plan:  1. Fever  - POCT Influenza A/B - POCT rapid strep A  2. Respiratory tract infection  - azithromycin (ZITHROMAX) 250 MG tablet; Take as directed  Dispense: 6 tablet; Refill: 0  3. Cough  - guaiFENesin-codeine (CHERATUSSIN AC) 100-10 MG/5ML syrup; Take 5 mLs by mouth 3 (three) times daily as needed for cough.  Dispense: 120 mL; Refill: 0 Increase fluid intake Motrin or tylenol OTC OTC decongestant Proper handwashing RTO if symptoms worsen or unresolved (cough persist longer than 2 weeks) Patient verbalized understanding Erby Pian, FNP-C

## 2013-09-10 NOTE — Patient Instructions (Signed)

## 2013-09-29 ENCOUNTER — Other Ambulatory Visit: Payer: Self-pay | Admitting: General Practice

## 2013-09-29 ENCOUNTER — Telehealth: Payer: Self-pay | Admitting: General Practice

## 2013-09-29 DIAGNOSIS — R059 Cough, unspecified: Secondary | ICD-10-CM

## 2013-09-29 DIAGNOSIS — R05 Cough: Secondary | ICD-10-CM

## 2013-09-29 MED ORDER — BENZONATATE 100 MG PO CAPS: 100.0000 mg | ORAL_CAPSULE | Freq: Three times a day (TID) | ORAL | Status: DC | PRN

## 2013-09-29 NOTE — Telephone Encounter (Signed)
Cough medication called into pharmacy, if cough doesn't improve or not totally resolved will need to come in for chest xray. In 1-2 weeks.

## 2013-09-29 NOTE — Telephone Encounter (Signed)
Detailed message left

## 2013-10-11 ENCOUNTER — Other Ambulatory Visit: Payer: Self-pay | Admitting: Family Medicine

## 2013-11-26 ENCOUNTER — Other Ambulatory Visit: Payer: Self-pay | Admitting: *Deleted

## 2013-11-26 MED ORDER — TADALAFIL 20 MG PO TABS: ORAL_TABLET | ORAL | Status: DC

## 2014-02-12 ENCOUNTER — Ambulatory Visit (INDEPENDENT_AMBULATORY_CARE_PROVIDER_SITE_OTHER): Payer: 59 | Admitting: General Practice

## 2014-02-12 ENCOUNTER — Encounter: Payer: Self-pay | Admitting: General Practice

## 2014-02-12 VITALS — BP 149/88 | HR 94 | Temp 100.3°F | Ht 67.0 in | Wt 232.4 lb

## 2014-02-12 DIAGNOSIS — W57XXXA Bitten or stung by nonvenomous insect and other nonvenomous arthropods, initial encounter: Secondary | ICD-10-CM

## 2014-02-12 DIAGNOSIS — M791 Myalgia, unspecified site: Secondary | ICD-10-CM

## 2014-02-12 DIAGNOSIS — IMO0001 Reserved for inherently not codable concepts without codable children: Secondary | ICD-10-CM

## 2014-02-12 DIAGNOSIS — R509 Fever, unspecified: Secondary | ICD-10-CM

## 2014-02-12 DIAGNOSIS — T148 Other injury of unspecified body region: Secondary | ICD-10-CM

## 2014-02-12 LAB — POCT INFLUENZA A/B
INFLUENZA A, POC: NEGATIVE
Influenza B, POC: NEGATIVE

## 2014-02-12 MED ORDER — DOXYCYCLINE HYCLATE 100 MG PO TABS: ORAL_TABLET | ORAL | Status: DC

## 2014-02-12 NOTE — Patient Instructions (Signed)
Tick Bite Information Ticks are insects that attach themselves to the skin and draw blood for food. There are various types of ticks. Common types include wood ticks and deer ticks. Most ticks live in shrubs and grassy areas. Ticks can climb onto your body when you make contact with leaves or grass where the tick is waiting. The most common places on the body for ticks to attach themselves are the scalp, neck, armpits, waist, and groin. Most tick bites are harmless, but sometimes ticks carry germs that cause diseases. These germs can be spread to a person during the tick's feeding process. The chance of a disease spreading through a tick bite depends on:   The type of tick.  Time of year.   How long the tick is attached.   Geographic location.  HOW CAN YOU PREVENT TICK BITES? Take these steps to help prevent tick bites when you are outdoors:  Wear protective clothing. Long sleeves and long pants are best.   Wear white clothes so you can see ticks more easily.  Tuck your pant legs into your socks.   If walking on a trail, stay in the middle of the trail to avoid brushing against bushes.  Avoid walking through areas with long grass.  Put insect repellent on all exposed skin and along boot tops, pant legs, and sleeve cuffs.   Check clothing, hair, and skin repeatedly and before going inside.   Brush off any ticks that are not attached.  Take a shower or bath as soon as possible after being outdoors.  WHAT IS THE PROPER WAY TO REMOVE A TICK? Ticks should be removed as soon as possible to help prevent diseases caused by tick bites. 1. If latex gloves are available, put them on before trying to remove a tick.  2. Using fine-point tweezers, grasp the tick as close to the skin as possible. You may also use curved forceps or a tick removal tool. Grasp the tick as close to its head as possible. Avoid grasping the tick on its body. 3. Pull gently with steady upward pressure until  the tick lets go. Do not twist the tick or jerk it suddenly. This may break off the tick's head or mouth parts. 4. Do not squeeze or crush the tick's body. This could force disease-carrying fluids from the tick into your body.  5. After the tick is removed, wash the bite area and your hands with soap and water or other disinfectant such as alcohol. 6. Apply a small amount of antiseptic cream or ointment to the bite site.  7. Wash and disinfect any instruments that were used.  Do not try to remove a tick by applying a hot match, petroleum jelly, or fingernail polish to the tick. These methods do not work and may increase the chances of disease being spread from the tick bite.  WHEN SHOULD YOU SEEK MEDICAL CARE? Contact your health care provider if you are unable to remove a tick from your skin or if a part of the tick breaks off and is stuck in the skin.  After a tick bite, you need to be aware of signs and symptoms that could be related to diseases spread by ticks. Contact your health care provider if you develop any of the following in the days or weeks after the tick bite:  Unexplained fever.  Rash. A circular rash that appears days or weeks after the tick bite may indicate the possibility of Lyme disease. The rash may resemble   a target with a bull's-eye and may occur at a different part of your body than the tick bite.  Redness and swelling in the area of the tick bite.   Tender, swollen lymph glands.   Diarrhea.   Weight loss.   Cough.   Fatigue.   Muscle, joint, or bone pain.   Abdominal pain.   Headache.   Lethargy or a change in your level of consciousness.  Difficulty walking or moving your legs.   Numbness in the legs.   Paralysis.  Shortness of breath.   Confusion.   Repeated vomiting.  Document Released: 08/18/2000 Document Revised: 06/11/2013 Document Reviewed: 01/29/2013 [employer] Patient Information 2014 [employer].  

## 2014-02-12 NOTE — Progress Notes (Signed)
   Subjective:    Patient ID: Thomas Gates, male    DOB: [birth year], 53 y.o.   MRN: [id number]  Fever  This is a new problem. The current episode started yesterday. The problem occurs daily. His temperature was unmeasured prior to arrival. The temperature was taken using an oral thermometer. Associated symptoms include muscle aches. Pertinent negatives include no abdominal pain, chest pain, congestion, coughing, ear pain, headaches, nausea, sore throat or vomiting. He has tried acetaminophen for the symptoms.  Patient reports removing a tick from upper left leg 2 days ago. Tick fully intact. Denies applying ointments or creams to site. Slight redness at site.     Review of Systems  Constitutional: Positive for fever. Negative for chills.  HENT: Negative for congestion, ear pain, postnasal drip, rhinorrhea, sinus pressure and sore throat.   Respiratory: Negative for cough and chest tightness.   Cardiovascular: Negative for chest pain and palpitations.  Gastrointestinal: Negative for nausea, vomiting, abdominal pain and blood in stool.  Genitourinary: Negative for difficulty urinating.  Neurological: Negative for dizziness, weakness and headaches.       Objective:   Physical Exam  Constitutional: He is oriented to person, place, and time. He appears well-developed and well-nourished.  HENT:  Head: Normocephalic and atraumatic.  Right Ear: External ear normal.  Left Ear: External ear normal.  Mouth/Throat: Oropharynx is clear and moist.  Neck: Normal range of motion. Neck supple. No thyromegaly present.  Cardiovascular: Normal rate, regular rhythm and normal heart sounds.   Pulmonary/Chest: Effort normal and breath sounds normal. No respiratory distress. He exhibits no tenderness.  Abdominal: Soft. Bowel sounds are normal. He exhibits no distension. There is no tenderness.  Musculoskeletal: He exhibits no edema and no tenderness.  Lymphadenopathy:    He has no cervical adenopathy.    Neurological: He is alert and oriented to person, place, and time.  Skin: Skin is warm and dry. No rash noted.  Pin head sized area to left inner thigh. Erythematous, negative for warmth or drainage.   Psychiatric: He has a normal mood and affect.    Results for orders placed in visit on 02/12/14  POCT INFLUENZA A/B      Result Value Ref Range   Influenza A, POC Negative     Influenza B, POC Negative           Assessment & Plan:  1. Fever, unspecified  - POCT Influenza A/B - Lyme Ab/Western Blot Reflex - [place] mtn spotted fvr abs pnl(IgG+IgM)  2. Generalized muscle ache   3. Tick bite  - doxycycline (VIBRA-TABS) 100 MG tablet; Take one tablet twice a day on day one, then take one tablet daily for next 14 days  Dispense: 16 tablet; Refill: 0 -discussed and provided patient inform on tick bite -RTO if symptoms worsen or unresolved Patient verbalized understanding Erby Pian, FNP-C

## 2014-02-14 LAB — ROCKY MTN SPOTTED FVR ABS PNL(IGG+IGM)
RMSF IgG: NEGATIVE
RMSF IgM: 0.16 index (ref 0.00–0.89)

## 2014-02-14 LAB — LYME AB/WESTERN BLOT REFLEX: Lyme IgG/IgM Ab: <0.91 {ISR} (ref 0.00–0.90)

## 2014-02-16 ENCOUNTER — Telehealth: Payer: Self-pay | Admitting: Family Medicine

## 2014-02-16 NOTE — Telephone Encounter (Signed)
Patient aware.

## 2014-02-16 NOTE — Telephone Encounter (Signed)
Message copied by Waverly Ferrari on Mon Feb 16, 2014  9:34 AM ------      Message from: Erby Pian      Created: Sat Feb 14, 2014  1:07 PM       Labs wnl. ------

## 2016-04-26 ENCOUNTER — Telehealth: Payer: Self-pay

## 2016-04-26 NOTE — Telephone Encounter (Signed)
Patient called stating he has been having on and off chest pain today. Patient states that his chest pain has stopped at the moment. Offered patient an appointment for today and patient declined and states that he would not be able to come in until 05/04/16. Advised patient if he started having any chest pain or SOB to go to the ER. Patient verbalizes understanding and states that is wife is a Marine scientist and she would keep an eye on him. Made patient an apt 8/31 with MMM like he requested,

## 2016-05-04 ENCOUNTER — Ambulatory Visit (INDEPENDENT_AMBULATORY_CARE_PROVIDER_SITE_OTHER): Payer: 59 | Admitting: Nurse Practitioner

## 2016-05-04 ENCOUNTER — Encounter: Payer: Self-pay | Admitting: Nurse Practitioner

## 2016-05-04 VITALS — BP 124/87 | HR 61 | Temp 98.3°F | Ht 67.0 in | Wt 233.0 lb

## 2016-05-04 DIAGNOSIS — R079 Chest pain, unspecified: Secondary | ICD-10-CM | POA: Diagnosis not present

## 2016-05-04 DIAGNOSIS — N529 Male erectile dysfunction, unspecified: Secondary | ICD-10-CM | POA: Diagnosis not present

## 2016-05-04 DIAGNOSIS — K219 Gastro-esophageal reflux disease without esophagitis: Secondary | ICD-10-CM | POA: Diagnosis not present

## 2016-05-04 DIAGNOSIS — Z23 Encounter for immunization: Secondary | ICD-10-CM

## 2016-05-04 DIAGNOSIS — Z125 Encounter for screening for malignant neoplasm of prostate: Secondary | ICD-10-CM | POA: Diagnosis not present

## 2016-05-04 DIAGNOSIS — Z1159 Encounter for screening for other viral diseases: Secondary | ICD-10-CM | POA: Diagnosis not present

## 2016-05-04 MED ORDER — OMEPRAZOLE 40 MG PO CPDR: 40.0000 mg | DELAYED_RELEASE_CAPSULE | Freq: Every day | ORAL | 3 refills | Status: DC

## 2016-05-04 MED ORDER — TADALAFIL 20 MG PO TABS: ORAL_TABLET | ORAL | 2 refills | Status: DC

## 2016-05-04 NOTE — Addendum Note (Signed)
Addended by: Rolena Infante on: 05/04/2016 11:52 AM   Modules accepted: Orders

## 2016-05-04 NOTE — Progress Notes (Signed)
   Subjective:    Patient ID: Thomas Gates, male    DOB: Sep 18, 1960, 55 y.o.   MRN: [id number]  HPI Patient in today c/o chest pain- he actually called on 04/26/16 but refused to come in until today. He has been having chest pain for over a month. Describes it as a burning sensation but has occasional left arm pain. Has been stresseed at work and is very fatigued. He denies any SOB. He does eat a lot of spicy foods and started back on omeprazole and that has helped with chest pain.    Review of Systems  Constitutional: Positive for fatigue.  HENT: Negative.   Respiratory: Negative.  Negative for shortness of breath.   Cardiovascular: Positive for chest pain.  Gastrointestinal: Positive for nausea and vomiting.  Genitourinary: Negative.   Neurological: Negative.   Psychiatric/Behavioral: Negative.   All other systems reviewed and are negative.      Objective:   Physical Exam  Constitutional: He is oriented to person, place, and time. He appears well-developed and well-nourished. No distress.  Cardiovascular: Normal rate, regular rhythm and normal heart sounds.   Pulmonary/Chest: Effort normal and breath sounds normal.  Neurological: He is alert and oriented to person, place, and time.  Skin: Skin is warm.  Psychiatric: He has a normal mood and affect. His behavior is normal. Judgment and thought content normal.    EKG- NSR-Mary-Margaret Hassell Done, FNP  BP 124/87   Pulse 61   Temp 98.3 F (36.8 C) (Oral)   Ht '5\' 7"'$  (1.702 m)   Wt 233 lb (105.7 kg)   BMI 36.49 kg/m       Assessment & Plan:  1. Chest pain, unspecified chest pain type - EKG 12-Lead - CMP14+EGFR - Lipid panel - Ambulatory referral to Cardiology  2. Gastroesophageal reflux disease without esophagitis Avoid spicy foods Do not eat 2 hours prior to bedtime - Ambulatory referral to Gastroenterology - omeprazole (PRILOSEC) 40 MG capsule; Take 1 capsule (40 mg total) by mouth daily.  Dispense: 30 capsule;  Refill: 3  3. Erectile dysfunction, unspecified erectile dysfunction type - tadalafil (CIALIS) 20 MG tablet; TAKE ONE TABLET BY MOUTH AS NEEDED  Dispense: 9 tablet; Refill: 2  4. Prostate cancer screening - PSA, total and free  5. Need for hepatitis C screening test - Hepatitis C antibody   Labs pending Follow up in 6 months RTO prn  Mary-Margaret Hassell Done, FNP

## 2016-05-04 NOTE — Patient Instructions (Signed)
Nonspecific Chest Pain  °Chest pain can be caused by many different conditions. There is always a chance that your pain could be related to something serious, such as a heart attack or a blood clot in your lungs. Chest pain can also be caused by conditions that are not life-threatening. If you have chest pain, it is very important to follow up with your health care provider. °CAUSES  °Chest pain can be caused by: °· Heartburn. °· Pneumonia or bronchitis. °· Anxiety or stress. °· Inflammation around your heart (pericarditis) or lung (pleuritis or pleurisy). °· A blood clot in your lung. °· A collapsed lung (pneumothorax). It can develop suddenly on its own (spontaneous pneumothorax) or from trauma to the chest. °· Shingles infection (varicella-zoster virus). °· Heart attack. °· Damage to the bones, muscles, and cartilage that make up your chest wall. This can include: °¨ Bruised bones due to injury. °¨ Strained muscles or cartilage due to frequent or repeated coughing or overwork. °¨ Fracture to one or more ribs. °¨ Sore cartilage due to inflammation (costochondritis). °RISK FACTORS  °Risk factors for chest pain may include: °· Activities that increase your risk for trauma or injury to your chest. °· Respiratory infections or conditions that cause frequent coughing. °· Medical conditions or overeating that can cause heartburn. °· Heart disease or family history of heart disease. °· Conditions or health behaviors that increase your risk of developing a blood clot. °· Having had chicken pox (varicella zoster). °SIGNS AND SYMPTOMS °Chest pain can feel like: °· Burning or tingling on the surface of your chest or deep in your chest. °· Crushing, pressure, aching, or squeezing pain. °· Dull or sharp pain that is worse when you move, cough, or take a deep breath. °· Pain that is also felt in your back, neck, shoulder, or arm, or pain that spreads to any of these areas. °Your chest pain may come and go, or it may stay  constant. °DIAGNOSIS °Lab tests or other studies may be needed to find the cause of your pain. Your health care provider may have you take a test called an ambulatory ECG (electrocardiogram). An ECG records your heartbeat patterns at the time the test is performed. You may also have other tests, such as: °· Transthoracic echocardiogram (TTE). During echocardiography, sound waves are used to create a picture of all of the heart structures and to look at how blood flows through your heart. °· Transesophageal echocardiogram (TEE). This is a more advanced imaging test that obtains images from inside your body. It allows your health care provider to see your heart in finer detail. °· Cardiac monitoring. This allows your health care provider to monitor your heart rate and rhythm in real time. °· Holter monitor. This is a portable device that records your heartbeat and can help to diagnose abnormal heartbeats. It allows your health care provider to track your heart activity for several days, if needed. °· Stress tests. These can be done through exercise or by taking medicine that makes your heart beat more quickly. °· Blood tests. °· Imaging tests. °TREATMENT  °Your treatment depends on what is causing your chest pain. Treatment may include: °· Medicines. These may include: °¨ Acid blockers for heartburn. °¨ Anti-inflammatory medicine. °¨ Pain medicine for inflammatory conditions. °¨ Antibiotic medicine, if an infection is present. °¨ Medicines to dissolve blood clots. °¨ Medicines to treat coronary artery disease. °· Supportive care for conditions that do not require medicines. This may include: °¨ Resting. °¨ Applying heat   or cold packs to injured areas. °¨ Limiting activities until pain decreases. °HOME CARE INSTRUCTIONS °· If you were prescribed an antibiotic medicine, finish it all even if you start to feel better. °· Avoid any activities that bring on chest pain. °· Do not use any tobacco products, including  cigarettes, chewing tobacco, or electronic cigarettes. If you need help quitting, ask your health care provider. °· Do not drink alcohol. °· Take medicines only as directed by your health care provider. °· Keep all follow-up visits as directed by your health care provider. This is important. This includes any further testing if your chest pain does not go away. °· If heartburn is the cause for your chest pain, you may be told to keep your head raised (elevated) while sleeping. This reduces the chance that acid will go from your stomach into your esophagus. °· Make lifestyle changes as directed by your health care provider. These may include: °¨ Getting regular exercise. Ask your health care provider to suggest some activities that are safe for you. °¨ Eating a heart-healthy diet. A registered dietitian can help you to learn healthy eating options. °¨ Maintaining a healthy weight. °¨ Managing diabetes, if necessary. °¨ Reducing stress. °SEEK MEDICAL CARE IF: °· Your chest pain does not go away after treatment. °· You have a rash with blisters on your chest. °· You have a fever. °SEEK IMMEDIATE MEDICAL CARE IF:  °· Your chest pain is worse. °· You have an increasing cough, or you cough up blood. °· You have severe abdominal pain. °· You have severe weakness. °· You faint. °· You have chills. °· You have sudden, unexplained chest discomfort. °· You have sudden, unexplained discomfort in your arms, back, neck, or jaw. °· You have shortness of breath at any time. °· You suddenly start to sweat, or your skin gets clammy. °· You feel nauseous or you vomit. °· You suddenly feel light-headed or dizzy. °· Your heart begins to beat quickly, or it feels like it is skipping beats. °These symptoms may represent a serious problem that is an emergency. Do not wait to see if the symptoms will go away. Get medical help right away. Call your local emergency services (911 in the [place] Do not drive yourself to the hospital. °  °This  information is not intended to replace advice given to you by your health care provider. Make sure you discuss any questions you have with your health care provider. °  °Document Released: 05/31/2005 Document Revised: 09/11/2014 Document Reviewed: 03/27/2014 °Elsevier Interactive Patient Education ©2016 [employer] ° °

## 2016-05-05 LAB — CMP14+EGFR
ALK PHOS: 72 IU/L (ref 39–117)
ALT: 24 IU/L (ref 0–44)
AST: 20 IU/L (ref 0–40)
Albumin/Globulin Ratio: 1.6 (ref 1.2–2.2)
Albumin: 4.4 g/dL (ref 3.5–5.5)
BILIRUBIN TOTAL: 0.4 mg/dL (ref 0.0–1.2)
BUN/Creatinine Ratio: 21 — ABNORMAL HIGH (ref 9–20)
BUN: 18 mg/dL (ref 6–24)
CHLORIDE: 101 mmol/L (ref 96–106)
CO2: 25 mmol/L (ref 18–29)
Calcium: 9.4 mg/dL (ref 8.7–10.2)
Creatinine, Ser: 0.87 mg/dL (ref 0.76–1.27)
GFR calc Af Amer: 114 mL/min/{1.73_m2} (ref >59)
GFR calc non Af Amer: 99 mL/min/{1.73_m2} (ref >59)
GLUCOSE: 104 mg/dL — AB (ref 65–99)
Globulin, Total: 2.7 g/dL (ref 1.5–4.5)
Potassium: 5 mmol/L (ref 3.5–5.2)
Sodium: 140 mmol/L (ref 134–144)
Total Protein: 7.1 g/dL (ref 6.0–8.5)

## 2016-05-05 LAB — PSA, TOTAL AND FREE
PSA FREE: 0.15 ng/mL
PSA, Free Pct: 25 %
Prostate Specific Ag, Serum: 0.6 ng/mL (ref 0.0–4.0)

## 2016-05-05 LAB — LIPID PANEL
CHOLESTEROL TOTAL: 216 mg/dL — AB (ref 100–199)
Chol/HDL Ratio: 3.3 ratio units (ref 0.0–5.0)
HDL: 65 mg/dL (ref >39)
LDL Calculated: 131 mg/dL — ABNORMAL HIGH (ref 0–99)
Triglycerides: 101 mg/dL (ref 0–149)
VLDL CHOLESTEROL CAL: 20 mg/dL (ref 5–40)

## 2016-05-05 LAB — HEPATITIS C ANTIBODY

## 2016-05-10 ENCOUNTER — Encounter: Payer: Self-pay | Admitting: Cardiology

## 2016-05-12 ENCOUNTER — Encounter: Payer: Self-pay | Admitting: Nurse Practitioner

## 2016-05-15 ENCOUNTER — Encounter: Payer: Self-pay | Admitting: Cardiovascular Disease

## 2016-08-31 ENCOUNTER — Other Ambulatory Visit: Payer: Self-pay | Admitting: Nurse Practitioner

## 2016-08-31 DIAGNOSIS — K219 Gastro-esophageal reflux disease without esophagitis: Secondary | ICD-10-CM

## 2016-09-19 ENCOUNTER — Telehealth: Payer: 59 | Admitting: Family

## 2016-09-19 DIAGNOSIS — J069 Acute upper respiratory infection, unspecified: Secondary | ICD-10-CM | POA: Diagnosis not present

## 2016-09-19 DIAGNOSIS — R05 Cough: Secondary | ICD-10-CM

## 2016-09-19 DIAGNOSIS — R059 Cough, unspecified: Secondary | ICD-10-CM

## 2016-09-19 MED ORDER — PREDNISONE 10 MG (21) PO TBPK: 10.0000 mg | ORAL_TABLET | Freq: Every day | ORAL | 0 refills | Status: DC

## 2016-09-19 MED ORDER — AZITHROMYCIN 250 MG PO TABS: ORAL_TABLET | ORAL | 0 refills | Status: DC

## 2016-09-19 NOTE — Progress Notes (Signed)
We are sorry that you are not feeling well.  Here is how we plan to help!  Based on what you have shared with me it looks like you have upper respiratory tract inflammation that has resulted in a significant cough.  Inflammation and infection in the upper respiratory tract is commonly called bronchitis and has four common causes:  Allergies, Viral Infections, Acid Reflux and Bacterial Infections.  Allergies, viruses and acid reflux are treated by controlling symptoms or eliminating the cause. An example might be a cough caused by taking certain blood pressure medications. You stop the cough by changing the medication. Another example might be a cough caused by acid reflux. Controlling the reflux helps control the cough.  Based on your presentation I believe you most likely have A cough due to bacteria.  When patients have a fever and a productive cough with a change in color or increased sputum production, we are concerned about bacterial bronchitis.  If left untreated it can progress to pneumonia.  If your symptoms do not improve with your treatment plan it is important that you contact your provider.   I have prescribed Azithromyin 250 mg: two tables now and then one tablet daily for 4 additonal days    In addition you may use A non-prescription cough medication called Mucinex DM: take 2 tablets every 12 hours.  Sterapred 10 mg dosepak  USE OF BRONCHODILATOR ("RESCUE") INHALERS: There is a risk from using your bronchodilator too frequently.  The risk is that over-reliance on a medication which only relaxes the muscles surrounding the breathing tubes can reduce the effectiveness of medications prescribed to reduce swelling and congestion of the tubes themselves.  Although you feel brief relief from the bronchodilator inhaler, your asthma may actually be worsening with the tubes becoming more swollen and filled with mucus.  This can delay other crucial treatments, such as oral steroid medications. If you  need to use a bronchodilator inhaler daily, several times per day, you should discuss this with your provider.  There are probably better treatments that could be used to keep your asthma under control.     HOME CARE . Only take medications as instructed by your medical team. . Complete the entire course of an antibiotic. . Drink plenty of fluids and get plenty of rest. . Avoid close contacts especially the very young and the elderly . Cover your mouth if you cough or cough into your sleeve. . Always remember to wash your hands . A steam or ultrasonic humidifier can help congestion.   GET HELP RIGHT AWAY IF: . You develop worsening fever. . You become short of breath . You cough up blood. . Your symptoms persist after you have completed your treatment plan MAKE SURE YOU   Understand these instructions.  Will watch your condition.  Will get help right away if you are not doing well or get worse.  Your e-visit answers were reviewed by a board certified advanced clinical practitioner to complete your personal care plan.  Depending on the condition, your plan could have included both over the counter or prescription medications. If there is a problem please reply  once you have received a response from your provider. Your safety is important to [place].  If you have drug allergies check your prescription carefully.    You can use MyChart to ask questions about today's visit, request a non-urgent call back, or ask for a work or school excuse for 24 hours related to this e-Visit. If  it has been greater than 24 hours you will need to follow up with your provider, or enter a new e-Visit to address those concerns. You will get an e-mail in the next two days asking about your experience.  I hope that your e-visit has been valuable and will speed your recovery. Thank you for using e-visits.

## 2016-11-01 ENCOUNTER — Encounter: Payer: Self-pay | Admitting: Internal Medicine

## 2016-11-01 ENCOUNTER — Ambulatory Visit (INDEPENDENT_AMBULATORY_CARE_PROVIDER_SITE_OTHER): Payer: 59 | Admitting: Nurse Practitioner

## 2016-11-01 ENCOUNTER — Encounter: Payer: Self-pay | Admitting: Nurse Practitioner

## 2016-11-01 VITALS — BP 120/75 | HR 74 | Temp 97.2°F | Ht 67.0 in | Wt 240.0 lb

## 2016-11-01 DIAGNOSIS — J301 Allergic rhinitis due to pollen: Secondary | ICD-10-CM | POA: Diagnosis not present

## 2016-11-01 DIAGNOSIS — N529 Male erectile dysfunction, unspecified: Secondary | ICD-10-CM

## 2016-11-01 DIAGNOSIS — K219 Gastro-esophageal reflux disease without esophagitis: Secondary | ICD-10-CM

## 2016-11-01 MED ORDER — TADALAFIL 20 MG PO TABS: ORAL_TABLET | ORAL | 12 refills | Status: DC

## 2016-11-01 MED ORDER — CETIRIZINE HCL 10 MG PO TABS: 10.0000 mg | ORAL_TABLET | Freq: Every day | ORAL | 1 refills | Status: DC

## 2016-11-01 MED ORDER — OMEPRAZOLE 40 MG PO CPDR: 40.0000 mg | DELAYED_RELEASE_CAPSULE | Freq: Every day | ORAL | 1 refills | Status: DC

## 2016-11-01 NOTE — Addendum Note (Signed)
Addended by: Chevis Pretty on: 11/01/2016 11:28 AM   Modules accepted: Orders

## 2016-11-01 NOTE — Progress Notes (Signed)
   Subjective:    Patient ID: LOGON UTTECH, male    DOB: Aug 07, 1961, 56 y.o.   MRN: [id number]  HPI  Patient here today for follow up of chronic medical problems.  Outpatient Encounter Prescriptions as of 11/01/2016  Medication Sig  . cetirizine (ZYRTEC) 10 MG tablet Take 10 mg by mouth daily.  . fish oil-omega-3 fatty acids 1000 MG capsule Take 2 g by mouth daily.  Marland Kitchen omeprazole (PRILOSEC) 40 MG capsule TAKE ONE CAPSULE BY MOUTH DAILY  . tadalafil (CIALIS) 20 MG tablet TAKE ONE TABLET BY MOUTH AS NEEDED   GERD Was on omeprazole and was working great and he ran out of meds. Allergic rhinitis On zyrtec and works well. ED Not sure of cause- cialis works well without side effects.    Review of Systems  Constitutional: Negative.   HENT: Negative.   Respiratory: Negative.   Cardiovascular: Negative.   Genitourinary: Negative.   Neurological: Negative.   Psychiatric/Behavioral: Negative.   All other systems reviewed and are negative.      Objective:   Physical Exam  Constitutional: He is oriented to person, place, and time. He appears well-developed and well-nourished.  HENT:  Head: Normocephalic.  Right Ear: External ear normal.  Left Ear: External ear normal.  Nose: Nose normal.  Mouth/Throat: Oropharynx is clear and moist.  Eyes: EOM are normal. Pupils are equal, round, and reactive to light.  Neck: Normal range of motion. Neck supple. No JVD present. No thyromegaly present.  Cardiovascular: Normal rate, regular rhythm, normal heart sounds and intact distal pulses.  Exam reveals no gallop and no friction rub.   No murmur heard. Pulmonary/Chest: Effort normal and breath sounds normal. No respiratory distress. He has no wheezes. He has no rales. He exhibits no tenderness.  Abdominal: Soft. Bowel sounds are normal. He exhibits no mass. There is no tenderness.  Genitourinary: Prostate normal and penis normal.  Musculoskeletal: Normal range of motion. He exhibits no edema.   Lymphadenopathy:    He has no cervical adenopathy.  Neurological: He is alert and oriented to person, place, and time. No cranial nerve deficit.  Skin: Skin is warm and dry.  Psychiatric: He has a normal mood and affect. His behavior is normal. Judgment and thought content normal.   BP 120/75   Pulse 74   Temp 97.2 F (36.2 C) (Oral)   Ht '5\' 7"'$  (1.702 m)   Wt 240 lb (108.9 kg)   BMI 37.59 kg/m        Assessment & Plan:  1. Gastroesophageal reflux disease without esophagitis Avoid spicy foods Do not eat 2 hours prior to bedtime - omeprazole (PRILOSEC) 40 MG capsule; Take 1 capsule (40 mg total) by mouth daily.  Dispense: 90 capsule; Refill: 1 - CMP14+EGFR - Lipid panel  2. Chronic allergic rhinitis due to pollen, unspecified seasonality - cetirizine (ZYRTEC) 10 MG tablet; Take 1 tablet (10 mg total) by mouth daily.  Dispense: 90 tablet; Refill: 1  3. Erectile dysfunction, unspecified erectile dysfunction type - tadalafil (CIALIS) 20 MG tablet; TAKE ONE TABLET BY MOUTH AS NEEDED  Dispense: 9 tablet; Refill: 12    Labs pending Health maintenance reviewed Diet and exercise encouraged Continue all meds Follow up  In 6 months   [place], FNP

## 2016-11-01 NOTE — Patient Instructions (Signed)
Food Choices for Gastroesophageal Reflux Disease, Adult When you have gastroesophageal reflux disease (GERD), the foods you eat and your eating habits are very important. Choosing the right foods can help ease your discomfort. What guidelines do I need to follow?  Choose fruits, vegetables, whole grains, and low-fat dairy products.  Choose low-fat meat, fish, and poultry.  Limit fats such as oils, salad dressings, butter, nuts, and avocado.  Keep a food diary. This helps you identify foods that cause symptoms.  Avoid foods that cause symptoms. These may be different for everyone.  Eat small meals often instead of 3 large meals a day.  Eat your meals slowly, in a place where you are relaxed.  Limit fried foods.  Cook foods using methods other than frying.  Avoid drinking alcohol.  Avoid drinking large amounts of liquids with your meals.  Avoid bending over or lying down until 2-3 hours after eating. What foods are not recommended? These are some foods and drinks that may make your symptoms worse: Vegetables  Tomatoes. Tomato juice. Tomato and spaghetti sauce. Chili peppers. Onion and garlic. Horseradish. Fruits  Oranges, grapefruit, and lemon (fruit and juice). Meats  High-fat meats, fish, and poultry. This includes hot dogs, ribs, ham, sausage, salami, and bacon. Dairy  Whole milk and chocolate milk. Sour cream. Cream. Butter. Ice cream. Cream cheese. Drinks  Coffee and tea. Bubbly (carbonated) drinks or energy drinks. Condiments  Hot sauce. Barbecue sauce. Sweets/Desserts  Chocolate and cocoa. Donuts. Peppermint and spearmint. Fats and Oils  High-fat foods. This includes French fries and potato chips. Other  Vinegar. Strong spices. This includes black pepper, white pepper, red pepper, cayenne, curry powder, cloves, ginger, and chili powder. The items listed above may not be a complete list of foods and drinks to avoid. Contact your dietitian for more information.    This information is not intended to replace advice given to you by your health care provider. Make sure you discuss any questions you have with your health care provider. Document Released: 02/20/2012 Document Revised: 01/27/2016 Document Reviewed: 06/25/2013 Elsevier Interactive Patient Education  2017 Elsevier Inc.  

## 2016-11-02 LAB — LIPID PANEL
CHOLESTEROL TOTAL: 209 mg/dL — AB (ref 100–199)
Chol/HDL Ratio: 4.3 ratio units (ref 0.0–5.0)
HDL: 49 mg/dL (ref >39)
LDL CALC: 123 mg/dL — AB (ref 0–99)
Triglycerides: 186 mg/dL — ABNORMAL HIGH (ref 0–149)
VLDL CHOLESTEROL CAL: 37 mg/dL (ref 5–40)

## 2016-11-02 LAB — CMP14+EGFR
ALT: 23 IU/L (ref 0–44)
AST: 20 IU/L (ref 0–40)
Albumin/Globulin Ratio: 1.9 (ref 1.2–2.2)
Albumin: 4.3 g/dL (ref 3.5–5.5)
Alkaline Phosphatase: 76 IU/L (ref 39–117)
BILIRUBIN TOTAL: 0.3 mg/dL (ref 0.0–1.2)
BUN/Creatinine Ratio: 17 (ref 9–20)
BUN: 18 mg/dL (ref 6–24)
CHLORIDE: 101 mmol/L (ref 96–106)
CO2: 28 mmol/L (ref 18–29)
CREATININE: 1.04 mg/dL (ref 0.76–1.27)
Calcium: 9.2 mg/dL (ref 8.7–10.2)
GFR calc non Af Amer: 82 mL/min/{1.73_m2} (ref >59)
GFR, EST AFRICAN AMERICAN: 94 mL/min/{1.73_m2} (ref >59)
GLUCOSE: 91 mg/dL (ref 65–99)
Globulin, Total: 2.3 g/dL (ref 1.5–4.5)
Potassium: 4.9 mmol/L (ref 3.5–5.2)
Sodium: 143 mmol/L (ref 134–144)
TOTAL PROTEIN: 6.6 g/dL (ref 6.0–8.5)

## 2016-11-07 ENCOUNTER — Telehealth: Payer: Self-pay

## 2016-11-07 MED ORDER — SILDENAFIL CITRATE 100 MG PO TABS: 50.0000 mg | ORAL_TABLET | Freq: Every day | ORAL | 11 refills | Status: DC | PRN

## 2016-11-07 NOTE — Telephone Encounter (Signed)
Left message stating that medication has been changed per insurance and to call back with any questions or concerns

## 2016-11-07 NOTE — Telephone Encounter (Signed)
Cialis changed to Viagra per insurance

## 2016-12-12 ENCOUNTER — Encounter: Payer: Self-pay | Admitting: Internal Medicine

## 2016-12-12 ENCOUNTER — Ambulatory Visit (INDEPENDENT_AMBULATORY_CARE_PROVIDER_SITE_OTHER): Payer: 59 | Admitting: Internal Medicine

## 2016-12-12 VITALS — BP 118/84 | HR 73 | Ht 64.0 in | Wt 236.0 lb

## 2016-12-12 DIAGNOSIS — Z1211 Encounter for screening for malignant neoplasm of colon: Secondary | ICD-10-CM | POA: Diagnosis not present

## 2016-12-12 DIAGNOSIS — K219 Gastro-esophageal reflux disease without esophagitis: Secondary | ICD-10-CM

## 2016-12-12 MED ORDER — PANTOPRAZOLE SODIUM 20 MG PO TBEC: 20.0000 mg | DELAYED_RELEASE_TABLET | Freq: Every day | ORAL | 3 refills | Status: DC

## 2016-12-12 NOTE — Patient Instructions (Addendum)
You have been scheduled for a colonoscopy. Please follow written instructions given to you at your visit today.  Please pick up your prep supplies at the pharmacy. If you use inhalers (even only as needed), please bring them with you on the day of your procedure. Your physician has requested that you go to www.startemmi.com and enter the access code given to you at your visit today. This web site gives a general overview about your procedure. However, you should still follow specific instructions given to you by our office regarding your preparation for the procedure.   We have sent the following medications to your pharmacy for you to pick up at your convenience: Pantoprazole 20mg    Try and decrease your caffeine intake.    I appreciate the opportunity to care for you. Silvano Rusk, MD, [facility]

## 2016-12-12 NOTE — Progress Notes (Signed)
Thomas Gates 56 y.o. 1961-05-09 [id number] Referred by: Chevis Pretty, *  Assessment & Plan:   Encounter Diagnoses  Name Primary?  . Gastroesophageal reflux disease, esophagitis presence not specified Yes  . Colon cancer screening    I think his response to PPI treatment tells [place] he has GERD, and since he does not have dysphagia, unintentional weight loss, bleeding or anemia endoscopy is not necessary. We did review the possibility of Barrett's screening but he is really only had symptoms for a few years and it's really not clear if that helps or not, so we have decided to hold off on that. Consider later on in his life depending upon how he does. He understands the rare possible risk of cancer associated with this and agrees with this plan.  He will try 20 mg pantoprazole set of 40 mg omeprazole, the pantoprazole will be free with no co-pay on his insurance, and perhaps he can tolerate a lower dose. If not we will titrate back upwards. Keep on lifestyle changes and improvement andTry to reduce caffeine  We reviewed the various ways to screen for colon cancer including Hemoccult stool testing, Cologuard stool testing and colonoscopy. He has chosen colonoscopy.The risks and benefits as well as alternatives of endoscopic procedure(s) have been discussed and reviewed. All questions answered. The patient agrees to proceed.  I appreciate the opportunity to care for this patient. CC: Mary-Margaret Hassell Done, FNP   Subjective:   Chief Complaint: GERD  HPI The patient is here at the request of his primary care provider for evaluation and treatment of GERD. Over the past few years she has started having more heartburn and indigestion symptoms. Does love spicy foods. He was drinking caffeine a fair amount. Last summer he started having some chest pain problems burning etc. he wondered if it was his heart, he was evaluated and eventually placed on PPI, omeprazole, he used 20 mg  over-the-counter one point and he is also used 40 mg daily omeprazole which he is on now and that relieved all of his symptoms. He is working on avoiding spicy foods. He has not had a significant weight gain. He has reduced caffeine but still drinks about 4 glasses of caffeinated tea daily. No dysphagia anemia or bleeding reported. He has not had colonoscopy at this point or other colon cancer screening that I'm aware of. He does not have any rectal bleeding or constipation or bowel changes. Does not use tobacco or alcohol Allergies  Allergen Reactions  . Penicillins Shortness Of Breath    Rash also   Current Meds  Medication Sig  . cetirizine (ZYRTEC) 10 MG tablet Take 1 tablet (10 mg total) by mouth daily.  . fish oil-omega-3 fatty acids 1000 MG capsule Take 2 g by mouth daily.  . sildenafil (VIAGRA) 100 MG tablet Take 0.5-1 tablets (50-100 mg total) by mouth daily as needed for erectile dysfunction.  . [DISCONTINUED] omeprazole (PRILOSEC) 40 MG capsule Take 1 capsule (40 mg total) by mouth daily.   Past Medical History:  Diagnosis Date  . Arthritis    rt hand  . Chronic sinusitis   . Deviated septum   . Dysrhythmia    SVT  . ED (erectile dysfunction)   . GERD (gastroesophageal reflux disease)   . Hyperlipidemia   . Nasal polyps    Past Surgical History:  Procedure Laterality Date  . HAND SURGERY  1999  . NASAL SEPTOPLASTY W/ TURBINOPLASTY Bilateral 05/09/2013   Procedure: NASAL SEPTOPLASTY AND BILATERAL  INFERIOR TURBINATE REDUCTION, ;  Surgeon: Jerrell Belfast, MD;  Location: [place];  Service: ENT;  Laterality: Bilateral;  . SINUS ENDO W/FUSION Bilateral 05/09/2013   Procedure: BILATERAL ENDOSCOPIC SINUS SURGERY WITH FUSION NAVIGATION;  Surgeon: Jerrell Belfast, MD;  Location: [place];  Service: ENT;  Laterality: Bilateral;  . SKIN GRAFT     right hand  . svt ablation     Social History   Social History  . Marital status: Married    Spouse  name: N/A  . Number of children: 3  . Years of education: N/A   Social History Main Topics  . Smoking status: Never Smoker  . Smokeless tobacco: Never Used  . Alcohol use No  . Drug use: No  . Sexual activity: Not Asked   Other Topics Concern  . None   Social History Narrative   Married second time   1 daughter born 49 from first marriage   1 son born 2002 from second marriage, daughter born 2006 from second marriage   He is a Public house manager   Wife is an Therapist, sports on the life care ambulance with [employer]   4 caffeinated beverages daily   12/12/2016      Family History  Problem Relation Age of Onset  . Heart attack Mother     smoker  . COPD Mother   . Emphysema Father     smoker  . Colon cancer Neg Hx   . Stomach cancer Neg Hx      Review of Systems Has some insomnia joint pains allergy problems. All other review of systems are negative  Objective:   Physical Exam @BP  118/84   Pulse 73   Ht 5\' 4"  (1.626 m)   Wt 236 lb (107 kg)   BMI 40.51 kg/m @  General:  Well-developed, well-nourished and in no acute distress he is obese Eyes:  anicteric. ENT:   Mouth and posterior pharynx free of lesions.  Neck:   supple w/o thyromegaly or mass.  Lungs: Clear to auscultation bilaterally. Heart:  S1S2, no rubs, murmurs, gallops. Abdomen:  soft, non-tender, no hepatosplenomegaly, hernia, or mass and BS+.  Rectal:  This is deferred until colonoscopy Lymph:  no cervical or supraclavicular adenopathy. Extremities:   no edema, cyanosis or clubbing Skin   no rash. Neuro:  A&O x 3.  Psych:  appropriate mood and  Affect.   Data Reviewed: Primary care notes 2018 labs in the EMR

## 2017-02-12 ENCOUNTER — Encounter: Payer: Self-pay | Admitting: Internal Medicine

## 2017-02-26 ENCOUNTER — Ambulatory Visit (AMBULATORY_SURGERY_CENTER): Payer: 59 | Admitting: Internal Medicine

## 2017-02-26 ENCOUNTER — Encounter: Payer: Self-pay | Admitting: Internal Medicine

## 2017-02-26 VITALS — BP 120/78 | HR 68 | Temp 98.4°F | Resp 12 | Ht 64.0 in | Wt 236.0 lb

## 2017-02-26 DIAGNOSIS — D120 Benign neoplasm of cecum: Secondary | ICD-10-CM

## 2017-02-26 DIAGNOSIS — Z1212 Encounter for screening for malignant neoplasm of rectum: Secondary | ICD-10-CM | POA: Diagnosis not present

## 2017-02-26 DIAGNOSIS — Z1211 Encounter for screening for malignant neoplasm of colon: Secondary | ICD-10-CM

## 2017-02-26 DIAGNOSIS — K219 Gastro-esophageal reflux disease without esophagitis: Secondary | ICD-10-CM | POA: Diagnosis not present

## 2017-02-26 MED ORDER — SODIUM CHLORIDE 0.9 % IV SOLN: 500.0000 mL | INTRAVENOUS | Status: DC

## 2017-02-26 MED ORDER — PANTOPRAZOLE SODIUM 40 MG PO TBEC: 40.0000 mg | DELAYED_RELEASE_TABLET | Freq: Every day | ORAL | 3 refills | Status: DC

## 2017-02-26 NOTE — Progress Notes (Signed)
Called to room to assist during endoscopic procedure.  Patient ID and intended procedure confirmed with present staff. Received instructions for my participation in the procedure from the performing physician.  

## 2017-02-26 NOTE — Progress Notes (Signed)
Report to PACU, RN, vss, BBS= Clear.  

## 2017-02-26 NOTE — Op Note (Signed)
[place] Patient Name: Thomas Gates Procedure Date: 02/26/2017 12:57 PM MRN: [id number] Endoscopist: Gatha Mayer , MD Age: 56 Referring MD:  Date of Birth: April 04, 1961 Gender: Male Account #: [id number] Procedure:                Colonoscopy Indications:              Screening for colorectal malignant neoplasm, This                            is the patient's first colonoscopy Medicines:                Monitored Anesthesia Care Procedure:                Pre-Anesthesia Assessment:                           - Prior to the procedure, a History and Physical                            was performed, and patient medications and                            allergies were reviewed. The patient's tolerance of                            previous anesthesia was also reviewed. The risks                            and benefits of the procedure and the sedation                            options and risks were discussed with the patient.                            All questions were answered, and informed consent                            was obtained. Prior Anticoagulants: The patient has                            taken no previous anticoagulant or antiplatelet                            agents. ASA Grade Assessment: III - A patient with                            severe systemic disease. After reviewing the risks                            and benefits, the patient was deemed in                            satisfactory condition to undergo the procedure.  After obtaining informed consent, the colonoscope                            was passed under direct vision. Throughout the                            procedure, the patient's blood pressure, pulse, and                            oxygen saturations were monitored continuously. The                            Colonoscope was introduced through the anus and                            advanced to the the  cecum, identified by                            appendiceal orifice and ileocecal valve. The                            colonoscopy was performed without difficulty. The                            patient tolerated the procedure well. The quality                            of the bowel preparation was good. The ileocecal                            valve, appendiceal orifice, and rectum were                            photographed. The bowel preparation used was                            Miralax. Scope In: 1:03:55 PM Scope Out: 1:18:23 PM Scope Withdrawal Time: 0 hours 12 minutes 24 seconds  Total Procedure Duration: 0 hours 14 minutes 28 seconds  Findings:                 The perianal and digital rectal examinations were                            normal. Pertinent negatives include normal prostate                            (size, shape, and consistency).                           A 3 mm polyp was found in the cecum. The polyp was                            sessile. The polyp was removed with a cold biopsy  forceps. Resection and retrieval were complete.                            Verification of patient identification for the                            specimen was done. Estimated blood loss was minimal.                           A few small-mouthed diverticula were found in the                            sigmoid colon.                           The exam was otherwise without abnormality on                            direct and retroflexion views. Complications:            No immediate complications. Estimated Blood Loss:     Estimated blood loss was minimal. Impression:               - One 3 mm polyp in the cecum, removed with a cold                            biopsy forceps. Resected and retrieved.                           - Diverticulosis in the sigmoid colon.                           - The examination was otherwise normal on direct                             and retroflexion views. Recommendation:           - Patient has a contact number available for                            emergencies. The signs and symptoms of potential                            delayed complications were discussed with the                            patient. Return to normal activities tomorrow.                            Written discharge instructions were provided to the                            patient.                           - Resume previous diet.                           -  Continue present medications.                           - Repeat colonoscopy is recommended. The                            colonoscopy date will be determined after pathology                            results from today's exam become available for                            review.                           - I changhed pantoprazole from 20 mg to 40 mg qd as                            20 mg not relieving GERD sxs adequately Gatha Mayer, MD 02/26/2017 1:27:39 PM This report has been signed electronically.

## 2017-02-26 NOTE — Patient Instructions (Addendum)
I found and removed one tiny polyp that looks benign. I will let you know pathology results and when to have another routine colonoscopy by mail and/or My Chart.  You also have a condition called diverticulosis - common and not usually a problem. Please read the handout provided.  I changes the pantoprazole to 40 mg daily instead of 20 - let me know if this does not keep GERD symptoms away.  I appreciate the opportunity to care for you. Gatha Mayer, MD, [facility]  Polyp handout given to patient. Diverticulosis handout given to patient.  YOU HAD AN ENDOSCOPIC PROCEDURE TODAY AT [facility]:   Refer to the procedure report that was given to you for any specific questions about what was found during the examination.  If the procedure report does not answer your questions, please call your gastroenterologist to clarify.  If you requested that your care partner not be given the details of your procedure findings, then the procedure report has been included in a sealed envelope for you to review at your convenience later.  YOU SHOULD EXPECT: Some feelings of bloating in the abdomen. Passage of more gas than usual.  Walking can help get rid of the air that was put into your GI tract during the procedure and reduce the bloating. If you had a lower endoscopy (such as a colonoscopy or flexible sigmoidoscopy) you may notice spotting of blood in your stool or on the toilet paper. If you underwent a bowel prep for your procedure, you may not have a normal bowel movement for a few days.  Please Note:  You might notice some irritation and congestion in your nose or some drainage.  This is from the oxygen used during your procedure.  There is no need for concern and it should clear up in a day or so.  SYMPTOMS TO REPORT IMMEDIATELY:   Following lower endoscopy (colonoscopy or flexible sigmoidoscopy):  Excessive amounts of blood in the stool  Significant tenderness or worsening of  abdominal pains  Swelling of the abdomen that is new, acute  Fever of 100F or higher For urgent or emergent issues, a gastroenterologist can be reached at any hour by calling [phone].   DIET:  We do recommend a small meal at first, but then you may proceed to your regular diet.  Drink plenty of fluids but you should avoid alcoholic beverages for 24 hours.  ACTIVITY:  You should plan to take it easy for the rest of today and you should NOT DRIVE or use heavy machinery until tomorrow (because of the sedation medicines used during the test).    FOLLOW UP: Our staff will call the number listed on your records the next business day following your procedure to check on you and address any questions or concerns that you may have regarding the information given to you following your procedure. If we do not reach you, we will leave a message.  However, if you are feeling well and you are not experiencing any problems, there is no need to return our call.  We will assume that you have returned to your regular daily activities without incident.  If any biopsies were taken you will be contacted by phone or by letter within the next 1-3 weeks.  Please call us at [phone] if you have not heard about the biopsies in 3 weeks.    SIGNATURES/CONFIDENTIALITY: You and/or your care partner have signed paperwork which will be entered into your  electronic medical record.  These signatures attest to the fact that that the information above on your After Visit Summary has been reviewed and is understood.  Full responsibility of the confidentiality of this discharge information lies with you and/or your care-partner.

## 2017-02-26 NOTE — Progress Notes (Signed)
Pt's states no medical or surgical changes since previsit or office visit. 

## 2017-02-27 ENCOUNTER — Telehealth: Payer: Self-pay | Admitting: *Deleted

## 2017-02-27 ENCOUNTER — Telehealth: Payer: Self-pay

## 2017-02-27 NOTE — Telephone Encounter (Signed)
  Follow up Call-  Call back number 02/26/2017  Post procedure Call Back phone  # [phone]  Permission to leave phone message Yes  Some recent data might be hidden     Patient questions:  Message left to call us if necessary.

## 2017-02-27 NOTE — Telephone Encounter (Signed)
  Follow up Call-  Call back number 02/26/2017  Post procedure Call Back phone  # [phone]  Permission to leave phone message Yes  Some recent data might be hidden     Left message

## 2017-03-02 ENCOUNTER — Encounter: Payer: Self-pay | Admitting: Internal Medicine

## 2017-03-02 DIAGNOSIS — Z86010 Personal history of colonic polyps: Secondary | ICD-10-CM

## 2017-03-02 DIAGNOSIS — Z860101 Personal history of adenomatous and serrated colon polyps: Secondary | ICD-10-CM

## 2017-03-02 HISTORY — DX: Personal history of adenomatous and serrated colon polyps: Z86.0101

## 2017-03-02 HISTORY — DX: Personal history of colonic polyps: Z86.010

## 2017-03-02 NOTE — Progress Notes (Signed)
3 mm adenoma Recall colon 5 years My Chart letter

## 2017-08-21 ENCOUNTER — Ambulatory Visit: Payer: 59 | Admitting: Osteopathic Medicine

## 2017-08-21 ENCOUNTER — Ambulatory Visit (INDEPENDENT_AMBULATORY_CARE_PROVIDER_SITE_OTHER): Payer: 59 | Admitting: Osteopathic Medicine

## 2017-08-21 ENCOUNTER — Encounter: Payer: Self-pay | Admitting: Osteopathic Medicine

## 2017-08-21 VITALS — BP 127/82 | HR 77 | Temp 97.7°F | Ht 65.0 in | Wt 241.0 lb

## 2017-08-21 DIAGNOSIS — R0789 Other chest pain: Secondary | ICD-10-CM | POA: Diagnosis not present

## 2017-08-21 DIAGNOSIS — Z23 Encounter for immunization: Secondary | ICD-10-CM

## 2017-08-21 DIAGNOSIS — K219 Gastro-esophageal reflux disease without esophagitis: Secondary | ICD-10-CM

## 2017-08-21 NOTE — Patient Instructions (Signed)
Though I'm not very worried about heart issues, at this point I'm not 100% confident that this is acid reflux alone. I think we should get some blood work and schedule you for a stress test just to be safe. I think seeing your GI doctor to discuss whether an EGD (scope down into the stomach to look for ulcers, gastritis or other abnormality) might be appropriate.

## 2017-08-21 NOTE — Progress Notes (Signed)
HPI: Thomas Gates is a 56 y.o. male who  has a past medical history of Arthritis, Chronic sinusitis, Deviated septum, Dysrhythmia, ED (erectile dysfunction), GERD (gastroesophageal reflux disease), adenomatous polyp of colon (03/02/2017), Hyperlipidemia, and Nasal polyps.  he presents to [facility] today, 08/21/17,  for chief complaint of:  Chief Complaint  Patient presents with  . Establish Care - chest discomfort     Very pleasant new patient here to establish care. Works as Investment banker, operational, owns his own business.   Chest discomfort: (+)Hx GERD, was on Omeprazole 20 mg daily, then 40 and this didn't help, then back to 20. Has been taking this daily for about a month. Symptoms have been going on for about a month. Described as burning in chest, points to L pectoral region and reports some L arm pain with this. Better with Protonix. Worse with spicy foods but doesn't hurt with every meal. No CP on exertion, sometimes can be at rest.   Hx SVT: s/p cardiac ablation 1997  Colonoscopy this year but no EGD done   (+)FH cardiac disease: father had heart attack but sounds like he had some other health issues (2 ppd smoker, respiratory occupational exposures, had one lung?), mom w/ HTN    Past medical, surgical, social and family history reviewed:  Patient Active Problem List   Diagnosis Date Noted  . Hx of adenomatous polyp of colon 03/02/2017  . Gastroesophageal reflux disease without esophagitis 11/01/2016  . Deviated nasal septum 05/09/2013  . Sinusitis, chronic 05/09/2013  . Allergic rhinitis 01/08/2013    Past Surgical History:  Procedure Laterality Date  . HAND SURGERY  1999  . NASAL SEPTOPLASTY W/ TURBINOPLASTY Bilateral 05/09/2013   Procedure: NASAL SEPTOPLASTY AND BILATERAL INFERIOR TURBINATE REDUCTION, ;  Surgeon: Jerrell Belfast, MD;  Location: [place];  Service: ENT;  Laterality: Bilateral;  . SINUS ENDO W/FUSION  Bilateral 05/09/2013   Procedure: BILATERAL ENDOSCOPIC SINUS SURGERY WITH FUSION NAVIGATION;  Surgeon: Jerrell Belfast, MD;  Location: [place];  Service: ENT;  Laterality: Bilateral;  . SKIN GRAFT     right hand  . svt ablation      Social History   Tobacco Use  . Smoking status: Never Smoker  . Smokeless tobacco: Never Used  Substance Use Topics  . Alcohol use: No    Family History  Problem Relation Age of Onset  . Heart attack Mother        smoker  . COPD Mother   . Emphysema Father        smoker  . Colon cancer Neg Hx   . Stomach cancer Neg Hx      Current medication list and allergy/intolerance information reviewed:    Current Outpatient Medications  Medication Sig Dispense Refill  . cetirizine (ZYRTEC) 10 MG tablet Take 1 tablet (10 mg total) by mouth daily. 90 tablet 1  . fish oil-omega-3 fatty acids 1000 MG capsule Take 2 g by mouth daily.    . pantoprazole (PROTONIX) 40 MG tablet Take 1 tablet (40 mg total) by mouth daily before breakfast. 90 tablet 3  . tadalafil (CIALIS) 20 MG tablet Take 20 mg by mouth daily as needed for erectile dysfunction.     No current facility-administered medications for this visit.     Allergies  Allergen Reactions  . Penicillins Shortness Of Breath    Rash also      Review of Systems:  Constitutional:  No  fever, no  chills, No recent illness, No unintentional weight changes. No significant fatigue.   HEENT: No  headache, no vision change, no hearing change, No sore throat, No  sinus pressure  Cardiac: +chest pain, No  pressure, No palpitations, No  Orthopnea  Respiratory:  No  shortness of breath. No  Cough  Gastrointestinal: No  abdominal pain, No  nausea, No  vomiting,  No  blood in stool, No  diarrhea, No  constipation   Musculoskeletal: No new myalgia/arthralgia  Genitourinary: No  incontinence, No  abnormal genital bleeding, No abnormal genital discharge  Skin: No  Rash  Hem/Onc: No  easy  bruising/bleeding  Endocrine: No cold intolerance,  No heat intolerance. No polyuria/polydipsia/polyphagia   Neurologic: No  weakness, No  dizziness  Psychiatric: concerns with depression, concerns with anxiety, sleep problems, No mood problems  Exam:  BP 127/82   Pulse 77   Temp 97.7 F (36.5 C) (Oral)   Ht 5\' 5"  (1.651 m)   Wt 241 lb (109.3 kg)   BMI 40.10 kg/m   Constitutional: VS see above. General Appearance: alert, well-developed, well-nourished, NAD  Eyes: Normal lids and conjunctive, non-icteric sclera  Ears, Nose, Mouth, Throat: MMM, Normal external inspection ears/nares/mouth/lips/gums.   Neck: No masses, trachea midline. No thyroid enlargement.   Respiratory: Normal respiratory effort. no wheeze, no rhonchi, no rales  Cardiovascular: S1/S2 normal, no murmur, no rub/gallop auscultated. RRR. No lower extremity edema.   Gastrointestinal: Nontender, no masses. No hepatomegaly, no splenomegaly. No hernia appreciated. Bowel sounds normal. Rectal exam deferred.   Musculoskeletal: Gait normal. No clubbing/cyanosis of digits.   Neurological: Normal balance/coordination. No tremor.   Skin: warm, dry, intact. No rash/ulcer. Marland Kitchen    Psychiatric: Normal judgment/insight. Normal mood and affect. Oriented x3.   No results found for this or any previous visit (from the past 72 hour(s)).  No results found.  EKG interpretation: Rate: 67 Rhythm: sinus No ST/T changes concerning for acute ischemia/infarct  Previous EKG 05/04/2016 same    ASSESSMENT/PLAN:   Burning chest pain - Plan: EKG 12-Lead, CBC, COMPLETE METABOLIC PANEL WITH GFR, Lipid panel, TSH, Exercise Tolerance Test  Need for influenza vaccination - Plan: Flu Vaccine QUAD 6+ mos PF IM (Fluarix Quad PF)  Gastroesophageal reflux disease without esophagitis - Plan: EKG 12-Lead, CBC, COMPLETE METABOLIC PANEL WITH GFR, Lipid panel, TSH    Patient Instructions  Though I'm not very worried about heart issues, at  this point I'm not 100% confident that this is acid reflux alone. I think we should get some blood work and schedule you for a stress test just to be safe. I think seeing your GI doctor to discuss whether an EGD (scope down into the stomach to look for ulcers, gastritis or other abnormality) might be appropriate.     Visit summary with medication list and pertinent instructions was printed for patient to review. All questions at time of visit were answered - patient instructed to contact office with any additional concerns. ER/RTC precautions were reviewed with the patient.   Follow-up plan: Return for recheck depending on labs/symptoms - annual physical when due .     Please note: voice recognition software was used to produce this document, and typos may escape review. Please contact Dr. Sheppard Coil for any needed clarifications.

## 2017-08-22 LAB — CBC
HCT: 41.4 % (ref 38.5–50.0)
Hemoglobin: 14.6 g/dL (ref 13.2–17.1)
MCH: 30.1 pg (ref 27.0–33.0)
MCHC: 35.3 g/dL (ref 32.0–36.0)
MCV: 85.4 fL (ref 80.0–100.0)
MPV: 9.5 fL (ref 7.5–12.5)
PLATELETS: 286 10*3/uL (ref 140–400)
RBC: 4.85 10*6/uL (ref 4.20–5.80)
RDW: 12.3 % (ref 11.0–15.0)
WBC: 7.1 10*3/uL (ref 3.8–10.8)

## 2017-08-22 LAB — COMPLETE METABOLIC PANEL WITH GFR
AG RATIO: 1.6 (calc) (ref 1.0–2.5)
ALT: 25 U/L (ref 9–46)
AST: 21 U/L (ref 10–35)
Albumin: 4.4 g/dL (ref 3.6–5.1)
Alkaline phosphatase (APISO): 85 U/L (ref 40–115)
BUN: 19 mg/dL (ref 7–25)
CALCIUM: 9.6 mg/dL (ref 8.6–10.3)
CO2: 29 mmol/L (ref 20–32)
Chloride: 103 mmol/L (ref 98–110)
Creat: 1.15 mg/dL (ref 0.70–1.33)
GFR, EST AFRICAN AMERICAN: 83 mL/min/{1.73_m2} (ref >=60)
GFR, EST NON AFRICAN AMERICAN: 72 mL/min/{1.73_m2} (ref >=60)
GLOBULIN: 2.7 g/dL (ref 1.9–3.7)
Glucose, Bld: 95 mg/dL (ref 65–99)
Potassium: 4.5 mmol/L (ref 3.5–5.3)
SODIUM: 139 mmol/L (ref 135–146)
TOTAL PROTEIN: 7.1 g/dL (ref 6.1–8.1)
Total Bilirubin: 0.5 mg/dL (ref 0.2–1.2)

## 2017-08-22 LAB — LIPID PANEL
Cholesterol: 222 mg/dL — ABNORMAL HIGH (ref <200)
HDL: 60 mg/dL (ref >40)
LDL Cholesterol (Calc): 137 mg/dL (calc) — ABNORMAL HIGH
NON-HDL CHOLESTEROL (CALC): 162 mg/dL — AB (ref <130)
Total CHOL/HDL Ratio: 3.7 (calc) (ref <5.0)
Triglycerides: 124 mg/dL (ref <150)

## 2017-08-22 LAB — TSH: TSH: 1.49 m[IU]/L (ref 0.40–4.50)

## 2017-09-09 ENCOUNTER — Encounter: Payer: Self-pay | Admitting: Osteopathic Medicine

## 2017-09-09 DIAGNOSIS — K219 Gastro-esophageal reflux disease without esophagitis: Secondary | ICD-10-CM

## 2017-10-21 ENCOUNTER — Telehealth: Payer: 59 | Admitting: Family

## 2017-10-21 DIAGNOSIS — R05 Cough: Secondary | ICD-10-CM | POA: Diagnosis not present

## 2017-10-21 DIAGNOSIS — B9689 Other specified bacterial agents as the cause of diseases classified elsewhere: Secondary | ICD-10-CM

## 2017-10-21 DIAGNOSIS — R059 Cough, unspecified: Secondary | ICD-10-CM

## 2017-10-21 DIAGNOSIS — J208 Acute bronchitis due to other specified organisms: Secondary | ICD-10-CM | POA: Diagnosis not present

## 2017-10-21 MED ORDER — BENZONATATE 100 MG PO CAPS: 100.0000 mg | ORAL_CAPSULE | Freq: Three times a day (TID) | ORAL | 0 refills | Status: DC | PRN

## 2017-10-21 MED ORDER — AZITHROMYCIN 250 MG PO TABS
ORAL_TABLET | ORAL | 0 refills | Status: DC
Start: 2017-10-21 — End: 2018-01-30

## 2017-10-21 NOTE — Progress Notes (Signed)

## 2017-11-25 ENCOUNTER — Emergency Department (INDEPENDENT_AMBULATORY_CARE_PROVIDER_SITE_OTHER)
Admission: EM | Admit: 2017-11-25 | Discharge: 2017-11-25 | Disposition: A | Discharge status: Home / Self Care | Payer: 59 | Source: Home / Self Care

## 2017-11-25 ENCOUNTER — Encounter: Payer: Self-pay | Admitting: Emergency Medicine

## 2017-11-25 ENCOUNTER — Emergency Department (INDEPENDENT_AMBULATORY_CARE_PROVIDER_SITE_OTHER): Payer: 59

## 2017-11-25 DIAGNOSIS — R0602 Shortness of breath: Secondary | ICD-10-CM | POA: Diagnosis not present

## 2017-11-25 DIAGNOSIS — R05 Cough: Secondary | ICD-10-CM | POA: Diagnosis not present

## 2017-11-25 DIAGNOSIS — J40 Bronchitis, not specified as acute or chronic: Secondary | ICD-10-CM | POA: Diagnosis not present

## 2017-11-25 LAB — POCT INFLUENZA A/B
INFLUENZA A, POC: NEGATIVE
Influenza B, POC: NEGATIVE

## 2017-11-25 MED ORDER — ALBUTEROL SULFATE HFA 108 (90 BASE) MCG/ACT IN AERS: 1.0000 | INHALATION_SPRAY | Freq: Four times a day (QID) | RESPIRATORY_TRACT | 0 refills | Status: DC | PRN

## 2017-11-25 MED ORDER — DOXYCYCLINE HYCLATE 100 MG PO CAPS
100.0000 mg | ORAL_CAPSULE | Freq: Two times a day (BID) | ORAL | 0 refills | Status: DC
Start: 2017-11-25 — End: 2018-01-30

## 2017-11-25 NOTE — Discharge Instructions (Signed)
See Dr. Sheppard Coil for recheck if symptoms persist

## 2017-11-25 NOTE — ED Triage Notes (Signed)
Patient presents to [facility] with C/O flu like symptoms. He states sick for about 18 days with a reoccurring illness. Fever today symptoms worse today.

## 2017-11-27 NOTE — ED Provider Notes (Signed)
[facility]    CSN: [id number] Arrival date & time: 11/25/17  1354     History   Chief Complaint Chief Complaint  Patient presents with  . Influenza  . Fever  . Cough    HPI Thomas Gates is a 57 y.o. male.   The history is provided by the patient. No language interpreter was used.  Influenza  Presenting symptoms: cough and fever   Severity:  Moderate Onset quality:  Gradual Duration:  18 days Progression:  Worsening Chronicity:  New Relieved by:  Nothing Worsened by:  Nothing Ineffective treatments:  Prescription medications Associated symptoms: chills and nasal congestion   Fever  Associated symptoms: chills, congestion and cough   Cough  Associated symptoms: chills and fever    Pt reports he has had cough and congestion for 18 days.  Pt reports he has something he just cant get rid of Past Medical History:  Diagnosis Date  . Arthritis    rt hand  . Chronic sinusitis   . Deviated septum   . Dysrhythmia    SVT  . ED (erectile dysfunction)   . GERD (gastroesophageal reflux disease)   . Hx of adenomatous polyp of colon 03/02/2017  . Hyperlipidemia   . Nasal polyps     Patient Active Problem List   Diagnosis Date Noted  . Burning chest pain 08/21/2017  . Hx of adenomatous polyp of colon 03/02/2017  . Gastroesophageal reflux disease without esophagitis 11/01/2016  . Deviated nasal septum 05/09/2013  . Sinusitis, chronic 05/09/2013  . Allergic rhinitis 01/08/2013    Past Surgical History:  Procedure Laterality Date  . HAND SURGERY  1999  . NASAL SEPTOPLASTY W/ TURBINOPLASTY Bilateral 05/09/2013   Procedure: NASAL SEPTOPLASTY AND BILATERAL INFERIOR TURBINATE REDUCTION, ;  Surgeon: Jerrell Belfast, MD;  Location: [place];  Service: ENT;  Laterality: Bilateral;  . SINUS ENDO W/FUSION Bilateral 05/09/2013   Procedure: BILATERAL ENDOSCOPIC SINUS SURGERY WITH FUSION NAVIGATION;  Surgeon: Jerrell Belfast, MD;  Location: [place];  Service: ENT;  Laterality: Bilateral;  . SKIN GRAFT     right hand  . svt ablation         Home Medications    Prior to Admission medications   Medication Sig Start Date End Date Taking? Authorizing Provider  albuterol (PROVENTIL HFA;VENTOLIN HFA) 108 (90 Base) MCG/ACT inhaler Inhale 1-2 puffs into the lungs every 6 (six) hours as needed for wheezing or shortness of breath. 11/25/17   Fransico Meadow, PA-C  azithromycin (ZITHROMAX) 250 MG tablet Take 500 mg once, then 250 mg for four days 10/21/17   Evelina Dun A, FNP  benzonatate (TESSALON PERLES) 100 MG capsule Take 1 capsule (100 mg total) by mouth 3 (three) times daily as needed. 10/21/17   Sharion Balloon, FNP  cetirizine (ZYRTEC) 10 MG tablet Take 1 tablet (10 mg total) by mouth daily. 11/01/16   Hassell Done Mary-Margaret, FNP  doxycycline (VIBRAMYCIN) 100 MG capsule Take 1 capsule (100 mg total) by mouth 2 (two) times daily. 11/25/17   Fransico Meadow, PA-C  fish oil-omega-3 fatty acids 1000 MG capsule Take 2 g by mouth daily.    [provider]  pantoprazole (PROTONIX) 40 MG tablet Take 1 tablet (40 mg total) by mouth daily before breakfast. 02/26/17   Gatha Mayer, MD  tadalafil (CIALIS) 20 MG tablet Take 20 mg by mouth daily as needed for erectile dysfunction.    [provider]  Family History Family History  Problem Relation Age of Onset  . Heart attack Mother        smoker  . COPD Mother   . Hypertension Mother   . Emphysema Father        smoker  . Heart disease Father   . Colon cancer Neg Hx   . Stomach cancer Neg Hx     Social History Social History   Tobacco Use  . Smoking status: Never Smoker  . Smokeless tobacco: Never Used  Substance Use Topics  . Alcohol use: No  . Drug use: No     Allergies   Penicillins   Review of Systems Review of Systems  Constitutional: Positive for chills and fever.  HENT: Positive for congestion.   Respiratory: Positive for cough.     All other systems reviewed and are negative.    Physical Exam Triage Vital Signs ED Triage Vitals  Enc Vitals Group     BP 11/25/17 1450 (!) 161/97     Pulse Rate 11/25/17 1450 76     Resp 11/25/17 1450 16     Temp 11/25/17 1450 98.6 F (37 C)     Temp Source 11/25/17 1450 Oral     SpO2 11/25/17 1450 97 %     Weight 11/25/17 1452 237 lb 12 oz (107.8 kg)     Height 11/25/17 1452 5\' 4"  (1.626 m)     Head Circumference --      Peak Flow --      Pain Score 11/25/17 1451 3     Pain Loc --      Pain Edu? --      Excl. in [place]? --    No data found.  Updated Vital Signs BP (!) 161/97 (BP Location: Right Arm)   Pulse 76   Temp 98.6 F (37 C) (Oral)   Resp 16   Ht 5\' 4"  (1.626 m)   Wt 237 lb 12 oz (107.8 kg)   SpO2 97%   BMI 40.81 kg/m   Visual Acuity Right Eye Distance:   Left Eye Distance:   Bilateral Distance:    Right Eye Near:   Left Eye Near:    Bilateral Near:     Physical Exam  Constitutional: He appears well-developed and well-nourished.  HENT:  Head: Normocephalic and atraumatic.  Eyes: Conjunctivae are normal.  Neck: Neck supple.  Cardiovascular: Normal rate and regular rhythm.  No murmur heard. Pulmonary/Chest: Effort normal and breath sounds normal. No respiratory distress.  Abdominal: Soft. There is no tenderness.  Musculoskeletal: He exhibits no edema.  Neurological: He is alert.  Skin: Skin is warm and dry.  Psychiatric: He has a normal mood and affect.  Nursing note and vitals reviewed.    UC Treatments / Results  Labs (all labs ordered are listed, but only abnormal results are displayed) Labs Reviewed  POCT INFLUENZA A/B    EKG None Radiology Dg Chest 2 View  Result Date: 11/25/2017 CLINICAL DATA:  Cough with shortness of breath. EXAM: CHEST - 2 VIEW COMPARISON:  None. FINDINGS: The lungs are clear without focal pneumonia, edema, pneumothorax or pleural effusion. The cardiopericardial silhouette is within normal limits for size. The  visualized bony structures of the thorax are intact. IMPRESSION: No active cardiopulmonary disease. Electronically Signed   By: Misty Stanley M.D.   On: 11/25/2017 16:03    Procedures Procedures (including critical care time)  Medications Ordered in UC Medications - No data to display   Initial  Impression / Assessment and Plan / UC Course  I have reviewed the triage vital signs and the nursing notes.  Pertinent labs & imaging results that were available during my care of the patient were reviewed by me and considered in my medical decision making (see chart for details).     MDM  Chest xray reviewed, no pneumonia.  I suspect Bronchitis,  Due to length of symptoms I will treat with doxycycline,  Pt also given albuterol   Final Clinical Impressions(s) / UC Diagnoses   Final diagnoses:  Bronchitis    ED Discharge Orders        Ordered    doxycycline (VIBRAMYCIN) 100 MG capsule  2 times daily     11/25/17 1618    albuterol (PROVENTIL HFA;VENTOLIN HFA) 108 (90 Base) MCG/ACT inhaler  Every 6 hours PRN     11/25/17 1618    An After Visit Summary was printed and given to the patient.   Controlled Substance Prescriptions [place] Controlled Substance Registry consulted? Not Applicable   Fransico Meadow, [place] 11/27/17 0254

## 2017-12-04 ENCOUNTER — Encounter: Payer: Self-pay | Admitting: Physician Assistant

## 2017-12-04 ENCOUNTER — Ambulatory Visit: Payer: 59 | Admitting: Internal Medicine

## 2017-12-04 ENCOUNTER — Ambulatory Visit (INDEPENDENT_AMBULATORY_CARE_PROVIDER_SITE_OTHER): Payer: 59 | Admitting: Physician Assistant

## 2017-12-04 VITALS — BP 127/83 | HR 68 | Temp 97.8°F | Ht 64.0 in | Wt 241.0 lb

## 2017-12-04 DIAGNOSIS — J301 Allergic rhinitis due to pollen: Secondary | ICD-10-CM | POA: Diagnosis not present

## 2017-12-04 DIAGNOSIS — J309 Allergic rhinitis, unspecified: Secondary | ICD-10-CM | POA: Diagnosis not present

## 2017-12-04 DIAGNOSIS — R05 Cough: Secondary | ICD-10-CM | POA: Diagnosis not present

## 2017-12-04 DIAGNOSIS — R059 Cough, unspecified: Secondary | ICD-10-CM

## 2017-12-04 MED ORDER — METHYLPREDNISOLONE 4 MG PO TBPK: ORAL_TABLET | ORAL | 0 refills | Status: DC

## 2017-12-04 MED ORDER — METHYLPREDNISOLONE SODIUM SUCC 125 MG IJ SOLR
125.0000 mg | Freq: Once | INTRAMUSCULAR | Status: AC
  Administered 2017-12-04: 125 mg via INTRAMUSCULAR

## 2017-12-04 NOTE — Patient Instructions (Signed)
Allergies An allergy is when your body reacts to a substance in a way that is not normal. An allergic reaction can happen after you:  Eat something.  Breathe in something.  Touch something.  You can be allergic to:  Things that are only around during certain seasons, like molds and pollens.  Foods.  Drugs.  Insects.  Animal dander.  What are the signs or symptoms?  Puffiness (swelling). This may happen on the lips, face, tongue, mouth, or throat.  Sneezing.  Coughing.  Breathing loudly (wheezing).  Stuffy nose.  Tingling in the mouth.  A rash.  Itching.  Itchy, red, puffy areas of skin (hives).  Watery eyes.  Throwing up (vomiting).  Watery poop (diarrhea).  Dizziness.  Feeling faint or fainting.  Trouble breathing or swallowing.  A tight feeling in the chest.  A fast heartbeat. How is this diagnosed? Allergies can be diagnosed with:  A medical and family history.  Skin tests.  Blood tests.  A food diary. A food diary is a record of all the foods, drinks, and symptoms you have each day.  The results of an elimination diet. This diet involves making sure not to eat certain foods and then seeing what happens when you start eating them again.  How is this treated? There is no cure for allergies, but allergic reactions can be treated with medicine. Severe reactions usually need to be treated at a hospital. How is this prevented? The best way to prevent an allergic reaction is to avoid the thing you are allergic to. Allergy shots and medicines can also help prevent reactions in some cases. This information is not intended to replace advice given to you by your health care provider. Make sure you discuss any questions you have with your health care provider. Document Released: 12/16/2012 Document Revised: 04/17/2016 Document Reviewed: 06/02/2014 Elsevier Interactive Patient Education  2018 Elsevier Inc.  

## 2017-12-04 NOTE — Progress Notes (Signed)
Subjective:    Patient ID: OTHER ATIENZA, male    DOB: October 28, 1960, 57 y.o.   MRN: [id number]  HPI Thomas Gates presents today with a cough and congestion for the past 2 months. He states that he first developed the cough and congestion when he went to [place] in the first week or two in February. He was riding motorcycles and the pollen was really bad there. He returned home and since has had his symptoms. He has had a couple episodes of fevers/chills, and a mild sore throat. His cough has been productive ranging from green/yellow sputum to more recently clear sputum. Was recently seen at urgent care on 3/24 and had a negative CXR, was given albuterol inhaler and doxycycline. He has one more day worth of antibiotic to take. He states that he does have seasonal allergies and they seem to be getting worse each year. He states that he is feeling better than he did when he went to the urgent care but is still feeling like he can't get over the cough.  .. Active Ambulatory Problems    Diagnosis Date Noted  . Allergic rhinitis 01/08/2013  . Deviated nasal septum 05/09/2013  . Sinusitis, chronic 05/09/2013  . Gastroesophageal reflux disease without esophagitis 11/01/2016  . Hx of adenomatous polyp of colon 03/02/2017  . Burning chest pain 08/21/2017   Resolved Ambulatory Problems    Diagnosis Date Noted  . No Resolved Ambulatory Problems   Past Medical History:  Diagnosis Date  . Arthritis   . Chronic sinusitis   . Deviated septum   . Dysrhythmia   . ED (erectile dysfunction)   . GERD (gastroesophageal reflux disease)   . Hx of adenomatous polyp of colon 03/02/2017  . Hyperlipidemia   . Nasal polyps       Review of Systems  Constitutional: Negative for activity change, chills and fever.  HENT: Positive for congestion, postnasal drip, rhinorrhea and sinus pressure. Negative for ear discharge, ear pain and sore throat.   Eyes: Positive for itching.  Respiratory: Positive for cough.  Negative for shortness of breath.   Cardiovascular: Negative for chest pain.  Gastrointestinal: Negative for nausea and vomiting.       Objective:   Physical Exam  Constitutional: He is oriented to person, place, and time. He appears well-developed and well-nourished. No distress.  HENT:  Head: Normocephalic and atraumatic.  Right Ear: Tympanic membrane and external ear normal.  Left Ear: Tympanic membrane and external ear normal.  Mouth/Throat: Posterior oropharyngeal erythema ( mild) present. No oropharyngeal exudate. Posterior oropharyngeal edema:  mild.  Eyes: Conjunctivae are normal.  Neck: Normal range of motion. Neck supple. No thyromegaly present.  Cardiovascular: Normal rate, regular rhythm, normal heart sounds and intact distal pulses.  Pulmonary/Chest: Effort normal and breath sounds normal. No respiratory distress.  Minimal coarse breath sounds in the lower lobes bilaterally  Lymphadenopathy:    He has no cervical adenopathy.  Neurological: He is alert and oriented to person, place, and time.  Skin: Skin is warm and dry.  Psychiatric: He has a normal mood and affect. His behavior is normal.          Assessment & Plan:  Marland KitchenMarland KitchenDiagnoses and all orders for this visit:  Allergic rhinitis due to pollen, unspecified seasonality -     methylPREDNISolone (MEDROL DOSEPAK) 4 MG TBPK tablet; Take as directed by package insert. -     methylPREDNISolone sodium succinate (SOLU-MEDROL) 125 mg/2 mL injection 125 mg  Cough -  methylPREDNISolone (MEDROL DOSEPAK) 4 MG TBPK tablet; Take as directed by package insert. -     methylPREDNISolone sodium succinate (SOLU-MEDROL) 125 mg/2 mL injection 125 mg  Allergic sinusitis -     methylPREDNISolone (MEDROL DOSEPAK) 4 MG TBPK tablet; Take as directed by package insert. -     methylPREDNISolone sodium succinate (SOLU-MEDROL) 125 mg/2 mL injection 125 mg   Reassurance given I did not see any signs of bacterial infection. Symptoms seem  most consistent with allergies. After medrol dose pack go back to zyrtec daily with flonase. Follow up if not improving.

## 2018-01-30 ENCOUNTER — Encounter: Payer: Self-pay | Admitting: Internal Medicine

## 2018-01-30 ENCOUNTER — Ambulatory Visit (INDEPENDENT_AMBULATORY_CARE_PROVIDER_SITE_OTHER): Payer: 59 | Admitting: Internal Medicine

## 2018-01-30 VITALS — BP 120/80 | HR 76 | Ht 64.0 in | Wt 238.6 lb

## 2018-01-30 DIAGNOSIS — R12 Heartburn: Secondary | ICD-10-CM | POA: Diagnosis not present

## 2018-01-30 DIAGNOSIS — R0789 Other chest pain: Secondary | ICD-10-CM | POA: Diagnosis not present

## 2018-01-30 NOTE — Patient Instructions (Signed)
  You have been scheduled for an endoscopy. Please follow written instructions given to you at your visit today. If you use inhalers (even only as needed), please bring them with you on the day of your procedure.   I appreciate the opportunity to care for you. Carl Gessner, MD, FACG 

## 2018-01-30 NOTE — Progress Notes (Signed)
Thomas Gates 57 y.o. 1961-07-05 [id number]  Assessment & Plan:   Encounter Diagnoses  Name Primary?  . Non-cardiac chest pain Yes  . Heartburn - despite PPI     Cause of his symptoms is not clear, last year he seemed to be responding to a PPI, but that has not persisted.  Reflux, atypical gallbladder symptoms, perhaps dysmotility perhaps even achalasia are possible though he does not have dysphagia.  I think at this point an EGD is appropriate to help sort this out.  Further plans pending that.   Note ultrasounds in the past 2008 2011 have been negative  The risks and benefits as well as alternatives of endoscopic procedure(s) have been discussed and reviewed. All questions answered. The patient agrees to proceed.  CC: Emeterio Reeve, DO   Subjective:   Chief Complaint: Chest pain burning in nature heartburn question reflux  HPI The patient is a 57 year old white man with a history of heartburn symptoms thought to be reflux disease, seen last year and was responding to PPI.  Since that time he felt like Protonix generic became ineffective and eventually gave up using it.  He describes episodic frequent postprandial (particularly at lunch) and nocturnal burning chest pain epigastric radiating up to the chest.  No referred pain to the back the pain is not sharp or colicky.  He has not been able to pinpoint food triggers.  He did reduce from 1.5 gallons of sweetened ice tea down to about a half a gallon a day.  His wife reports that antacids do not seem to provide effective relief on a consistent basis but sometimes do.  He does not describe dysphagia.  There is no unintentional weight loss. 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 and does not get any symptoms in his chest from that. Wt Readings from Last 3 Encounters:  01/30/18 238 lb 9.6 oz (108.2 kg)  12/04/17 241 lb (109.3 kg)  11/25/17 237 lb 12 oz (107.8 kg)     Allergies  Allergen Reactions  . Penicillins Shortness Of Breath    Rash also   Current Meds  Medication Sig  . cetirizine (ZYRTEC) 10 MG tablet Take 1 tablet (10 mg total) by mouth daily.  . fish oil-omega-3 fatty acids 1000 MG capsule Take 2 g by mouth daily.  . tadalafil (CIALIS) 20 MG tablet Take 20 mg by mouth daily as needed for erectile dysfunction.   Past Medical History:  Diagnosis Date  . Arthritis    rt hand  . Chronic sinusitis   . Deviated septum   . Dysrhythmia    SVT  . ED (erectile dysfunction)   . GERD (gastroesophageal reflux disease)   . Hx of adenomatous polyp of colon 03/02/2017  . Hyperlipidemia   . Nasal polyps    Past Surgical History:  Procedure Laterality Date  . COLONOSCOPY W/ BIOPSIES    . HAND SURGERY  1999  . NASAL SEPTOPLASTY W/ TURBINOPLASTY Bilateral 05/09/2013   Procedure: NASAL SEPTOPLASTY AND BILATERAL INFERIOR TURBINATE REDUCTION, ;  Surgeon: Jerrell Belfast, MD;  Location: [facility];  Service: ENT;  Laterality: Bilateral;  . SINUS ENDO W/FUSION Bilateral 05/09/2013   Procedure: BILATERAL ENDOSCOPIC SINUS SURGERY WITH FUSION NAVIGATION;  Surgeon: Jerrell Belfast, MD;  Location: [facility];  Service: ENT;  Laterality: Bilateral;  . SKIN GRAFT     right hand  . svt ablation  Social History   Social History Narrative   Married second time   1 daughter born 55 from first marriage   1 son born 2002 from second marriage, daughter born 2006 from second marriage   He is a Public house manager   Wife is an Therapist, sports on the life care ambulance with [employer]   4 caffeinated beverages daily   12/12/2016   family history includes COPD in his mother; Emphysema in his father; Heart attack in his mother; Heart disease in his father; Hypertension in his mother.   Review of Systems As per HPI  Objective:   Physical Exam BP 120/80   Pulse 76   Ht 5\' 4"  (1.626 m)   Wt 238 lb 9.6 oz (108.2 kg)   BMI 40.96 kg/m   No acute distress Eyes are anicteric Lungs clear Heart sounds are normal Chest wall is nontender Abdomen is obese soft nontender bowel sounds present  Data reviewed includes primary care notes, labs from December normal CMET CBC TSH

## 2018-01-31 ENCOUNTER — Other Ambulatory Visit: Payer: Self-pay

## 2018-01-31 ENCOUNTER — Encounter: Payer: Self-pay | Admitting: Internal Medicine

## 2018-01-31 ENCOUNTER — Encounter: Payer: Self-pay | Admitting: Osteopathic Medicine

## 2018-01-31 ENCOUNTER — Ambulatory Visit (AMBULATORY_SURGERY_CENTER): Payer: 59 | Admitting: Internal Medicine

## 2018-01-31 VITALS — BP 120/86 | HR 67 | Temp 98.7°F | Resp 19 | Ht 64.0 in | Wt 238.0 lb

## 2018-01-31 DIAGNOSIS — K2960 Other gastritis without bleeding: Secondary | ICD-10-CM | POA: Diagnosis not present

## 2018-01-31 DIAGNOSIS — R0789 Other chest pain: Secondary | ICD-10-CM | POA: Diagnosis not present

## 2018-01-31 DIAGNOSIS — K228 Other specified diseases of esophagus: Secondary | ICD-10-CM

## 2018-01-31 DIAGNOSIS — K2210 Ulcer of esophagus without bleeding: Secondary | ICD-10-CM | POA: Diagnosis not present

## 2018-01-31 DIAGNOSIS — R12 Heartburn: Secondary | ICD-10-CM | POA: Diagnosis not present

## 2018-01-31 DIAGNOSIS — K219 Gastro-esophageal reflux disease without esophagitis: Secondary | ICD-10-CM | POA: Diagnosis present

## 2018-01-31 DIAGNOSIS — Z9889 Other specified postprocedural states: Secondary | ICD-10-CM | POA: Insufficient documentation

## 2018-01-31 DIAGNOSIS — I471 Supraventricular tachycardia: Secondary | ICD-10-CM | POA: Diagnosis not present

## 2018-01-31 MED ORDER — SODIUM CHLORIDE 0.9 % IV SOLN: 500.0000 mL | Freq: Once | INTRAVENOUS | Status: DC

## 2018-01-31 MED ORDER — PANTOPRAZOLE SODIUM 40 MG PO TBEC: 40.0000 mg | DELAYED_RELEASE_TABLET | Freq: Two times a day (BID) | ORAL | 3 refills | Status: DC

## 2018-01-31 NOTE — Patient Instructions (Addendum)
   I found esophagitis and a polypoid change at the gastroesophageal junction and also saw gastritis with erosions (tiny ulcers).  Biopsies taken.  So you do appear to have GERD and acid-related issues - if you are using Aleve or ibuprofen that could be causing the gastritis.  Await biopsies and will sort out treatment.   Follow an antireflux regimen indefinitely.  This includes:      - Do not lie down for at least 3 to 4 hours after meals.       - Raise the head of the bed 4 to 6 inches.       - Decrease excess weight.       - Avoid citrus juices and other acidic foods, alcohol, chocolate, mints, coffee and other caffeinated beverages, carbonated beverages, fatty and fried foods.       - Avoid tight-fitting clothing.       - Avoid cigarettes and other tobacco products.    I appreciate the opportunity to care for you. Gatha Mayer, MD, FACG   YOU HAD AN ENDOSCOPIC PROCEDURE TODAY: Refer to the procedure report and other information in the discharge instructions given to you for any specific questions about what was found during the examination. If this information does not answer your questions, please call [facility] office at [phone] to clarify.   YOU SHOULD EXPECT: Some feelings of bloating in the abdomen. Passage of more gas than usual. Walking can help get rid of the air that was put into your GI tract during the procedure and reduce the bloating. If you had a lower endoscopy (such as a colonoscopy or flexible sigmoidoscopy) you may notice spotting of blood in your stool or on the toilet paper. Some abdominal soreness may be present for a day or two, also.  DIET: Your first meal following the procedure should be a light meal and then it is ok to progress to your normal diet. A half-sandwich or bowl of soup is an example of a good first meal. Heavy or fried foods are harder to digest and may make you feel nauseous or bloated. Drink plenty of fluids but you should avoid  alcoholic beverages for 24 hours. If you had a esophageal dilation, please see attached instructions for diet.    ACTIVITY: Your care partner should take you home directly after the procedure. You should plan to take it easy, moving slowly for the rest of the day. You can resume normal activity the day after the procedure however YOU SHOULD NOT DRIVE, use power tools, machinery or perform tasks that involve climbing or major physical exertion for 24 hours (because of the sedation medicines used during the test).   SYMPTOMS TO REPORT IMMEDIATELY: A gastroenterologist can be reached at any hour. Please call [phone]  for any of the following symptoms:   Following upper endoscopy (EGD, EUS, ERCP, esophageal dilation) Vomiting of blood or coffee ground material  New, significant abdominal pain  New, significant chest pain or pain under the shoulder blades  Painful or persistently difficult swallowing  New shortness of breath  Black, tarry-looking or red, bloody stools  FOLLOW UP:  If any biopsies were taken you will be contacted by phone or by letter within the next 1-3 weeks. Call [phone]  if you have not heard about the biopsies in 3 weeks.  Please also call with any specific questions about appointments or follow up tests.

## 2018-01-31 NOTE — Progress Notes (Signed)
Called to room to assist during endoscopic procedure.  Patient ID and intended procedure confirmed with present staff. Received instructions for my participation in the procedure from the performing physician.  

## 2018-01-31 NOTE — Op Note (Addendum)
[name] Procedure Date: 01/31/2018 7:33 AM MRN: [id number] Endoscopist: Gatha Mayer , MD Age: 57 Referring MD:  Date of Birth: 1961/07/03 Gender: Male Account #: [id number] Procedure:                Upper GI endoscopy Indications:              Indigestion, Heartburn, Chest pain (non cardiac) Medicines:                Propofol per Anesthesia, Monitored Anesthesia Care Procedure:                Pre-Anesthesia Assessment:                           - Prior to the procedure, a History and Physical                            was performed, and patient medications and                            allergies were reviewed. The patient's tolerance of                            previous anesthesia was also reviewed. The risks                            and benefits of the procedure and the sedation                            options and risks were discussed with the patient.                            All questions were answered, and informed consent                            was obtained. Prior Anticoagulants: The patient has                            taken no previous anticoagulant or antiplatelet                            agents. ASA Grade Assessment: II - A patient with                            mild systemic disease. After reviewing the risks                            and benefits, the patient was deemed in                            satisfactory condition to undergo the procedure.                           After obtaining informed consent, the endoscope was  passed under direct vision. Throughout the                            procedure, the patient's blood pressure, pulse, and                            oxygen saturations were monitored continuously. The                            Endoscope was introduced through the mouth, and                            advanced to the second part of duodenum. The upper                   GI endoscopy was accomplished without difficulty.                            The patient tolerated the procedure well. Scope In: Scope Out: Findings:                 LA Grade A (one or more mucosal breaks less than 5                            mm, not extending between tops of 2 mucosal folds)                            esophagitis with no bleeding was found in the                            distal esophagus with associated polypoid changes.                            Biopsies were taken with a cold forceps for                            histology. Verification of patient identification                            for the specimen was done. Estimated blood loss was                            minimal.                           Multiple dispersed, diminutive non-bleeding                            erosions were found in the gastric body and in the                            gastric antrum. There were stigmata of recent                            bleeding. Biopsies were taken with  a cold forceps                            for histology. Verification of patient                            identification for the specimen was done. Estimated                            blood loss was minimal.                           The exam was otherwise without abnormality.                           The cardia and gastric fundus were normal on                            retroflexion. Complications:            No immediate complications. Estimated Blood Loss:     Estimated blood loss was minimal. Impression:               - LA Grade A reflux esophagitis. Biopsied.                           - Non-bleeding erosive gastropathy. Biopsied.                           - The examination was otherwise normal. Recommendation:           - Patient has a contact number available for                            emergencies. The signs and symptoms of potential                            delayed complications  were discussed with the                            patient. Return to normal activities tomorrow.                            Written discharge instructions were provided to the                            patient.                           - GERD diet indefinitely.                           - Await pathology results.                           - Follow an antireflux regimen indefinitely.                           -  Retry PPI- bid pantoprazole at this time - he was                            having later in day sxs                           is using ibuprofen 800 mg prn qhs - frequently Gatha Mayer, MD 01/31/2018 8:06:16 AM This report has been signed electronically.

## 2018-01-31 NOTE — Progress Notes (Signed)
Report given to PACU, vss 

## 2018-02-01 ENCOUNTER — Telehealth: Payer: Self-pay

## 2018-02-01 NOTE — Telephone Encounter (Signed)
Second phone call attempt. No answer left message for patient.

## 2018-02-01 NOTE — Telephone Encounter (Signed)
No answer, message left for patient. 

## 2018-02-08 ENCOUNTER — Encounter: Payer: Self-pay | Admitting: Internal Medicine

## 2018-02-08 NOTE — Progress Notes (Signed)
Correction no reflux change and no gastritis Benign mucosa

## 2018-02-08 NOTE — Progress Notes (Signed)
Gastritis Reflux + benign hyperplastic GE junct polyp No recall Letter to patient by My Chart Needs to see me in 2-3 mos will have him call

## 2018-02-18 ENCOUNTER — Encounter: Payer: 59 | Admitting: Osteopathic Medicine

## 2018-02-18 ENCOUNTER — Ambulatory Visit (INDEPENDENT_AMBULATORY_CARE_PROVIDER_SITE_OTHER): Payer: 59 | Admitting: Osteopathic Medicine

## 2018-02-18 ENCOUNTER — Encounter: Payer: Self-pay | Admitting: Osteopathic Medicine

## 2018-02-18 VITALS — BP 119/71 | HR 59 | Temp 97.7°F | Ht 65.0 in | Wt 237.0 lb

## 2018-02-18 DIAGNOSIS — N529 Male erectile dysfunction, unspecified: Secondary | ICD-10-CM | POA: Diagnosis not present

## 2018-02-18 DIAGNOSIS — Z0000 Encounter for general adult medical examination without abnormal findings: Secondary | ICD-10-CM

## 2018-02-18 DIAGNOSIS — J301 Allergic rhinitis due to pollen: Secondary | ICD-10-CM

## 2018-02-18 DIAGNOSIS — R7301 Impaired fasting glucose: Secondary | ICD-10-CM | POA: Diagnosis not present

## 2018-02-18 DIAGNOSIS — M6780 Other specified disorders of synovium and tendon, unspecified site: Secondary | ICD-10-CM | POA: Diagnosis not present

## 2018-02-18 DIAGNOSIS — K219 Gastro-esophageal reflux disease without esophagitis: Secondary | ICD-10-CM

## 2018-02-18 DIAGNOSIS — M1990 Unspecified osteoarthritis, unspecified site: Secondary | ICD-10-CM

## 2018-02-18 DIAGNOSIS — Z7189 Other specified counseling: Secondary | ICD-10-CM

## 2018-02-18 MED ORDER — TADALAFIL 20 MG PO TABS: 20.0000 mg | ORAL_TABLET | Freq: Every day | ORAL | 11 refills | Status: DC | PRN

## 2018-02-18 MED ORDER — CETIRIZINE HCL 10 MG PO TABS: 10.0000 mg | ORAL_TABLET | Freq: Every day | ORAL | 3 refills | Status: AC

## 2018-02-18 NOTE — Progress Notes (Signed)
HPI: Thomas Gates is a 57 y.o. male who  has a past medical history of Allergy, Arthritis, CHF (congestive heart failure) ([place]), Chronic sinusitis, Deviated septum, Dysrhythmia, ED (erectile dysfunction), GERD (gastroesophageal reflux disease), adenomatous polyp of colon (03/02/2017), Hyperlipidemia, Nasal polyps, and Supraventricular tachycardia ([place]) (1999).  he presents to [facility] today, 02/18/18,  for chief complaint of: Annual Physical Exam   Patient here for annual physical / wellness exam.  See preventive care reviewed as below.  Recent labs reviewed in detail with the patient.   Additional concerns today include:   GERD: following with GI, recently got EGD, increased PPI     Past medical, surgical, social and family history reviewed:  Patient Active Problem List   Diagnosis Date Noted  . History of esophagogastroduodenoscopy (EGD) 01/31/2018  . Cough 12/04/2017  . Allergic sinusitis 12/04/2017  . Burning chest pain 08/21/2017  . Hx of adenomatous polyp of colon 03/02/2017  . Gastroesophageal reflux disease without esophagitis 11/01/2016  . Deviated nasal septum 05/09/2013  . Sinusitis, chronic 05/09/2013  . Allergic rhinitis 01/08/2013    Past Surgical History:  Procedure Laterality Date  . COLONOSCOPY    . COLONOSCOPY W/ BIOPSIES    . HAND SURGERY  1999  . NASAL SEPTOPLASTY W/ TURBINOPLASTY Bilateral 05/09/2013   Procedure: NASAL SEPTOPLASTY AND BILATERAL INFERIOR TURBINATE REDUCTION, ;  Surgeon: Jerrell Belfast, MD;  Location: [facility];  Service: ENT;  Laterality: Bilateral;  . SINUS ENDO W/FUSION Bilateral 05/09/2013   Procedure: BILATERAL ENDOSCOPIC SINUS SURGERY WITH FUSION NAVIGATION;  Surgeon: Jerrell Belfast, MD;  Location: [facility];  Service: ENT;  Laterality: Bilateral;  . SKIN GRAFT     right hand  . svt ablation    . UPPER GASTROINTESTINAL ENDOSCOPY      Social History    Tobacco Use  . Smoking status: Never Smoker  . Smokeless tobacco: Never Used  Substance Use Topics  . Alcohol use: No    Family History  Problem Relation Age of Onset  . Heart attack Mother        smoker  . COPD Mother   . Hypertension Mother   . Emphysema Father        smoker  . Heart disease Father   . Colon cancer Neg Hx   . Stomach cancer Neg Hx   . Esophageal cancer Neg Hx   . Rectal cancer Neg Hx      Current medication list and allergy/intolerance information reviewed:    Current Outpatient Medications  Medication Sig Dispense Refill  . AMBULATORY NON FORMULARY MEDICATION CBD Oil BID    . cetirizine (ZYRTEC) 10 MG tablet Take 1 tablet (10 mg total) by mouth daily. 90 tablet 3  . fish oil-omega-3 fatty acids 1000 MG capsule Take 2 g by mouth daily.    . pantoprazole (PROTONIX) 40 MG tablet Take 1 tablet (40 mg total) by mouth 2 (two) times daily before a meal. 180 tablet 3  . tadalafil (CIALIS) 20 MG tablet Take 1 tablet (20 mg total) by mouth daily as needed for erectile dysfunction. 10 tablet 11   No current facility-administered medications for this visit.     Allergies  Allergen Reactions  . Penicillins Shortness Of Breath    Rash also      Review of Systems:  Constitutional:  No  fever, no chills, No recent illness, No unintentional weight changes. No significant fatigue.   HEENT: No  headache, no  vision change, no hearing change, No sore throat, No  sinus pressure  Cardiac: No  chest pain, No  pressure, No palpitations, No  Orthopnea  Respiratory:  No  shortness of breath. No  Cough  Gastrointestinal: No  abdominal pain, No  nausea, No  vomiting,  No  blood in stool, No  diarrhea, No  constipation   Musculoskeletal: No new myalgia/arthralgia  Skin: No  Rash, No other wounds/concerning lesions  Genitourinary: No  incontinence, No  abnormal genital bleeding, No abnormal genital discharge  Hem/Onc: No  easy bruising/bleeding, No  abnormal lymph  node  Endocrine: No cold intolerance,  No heat intolerance. No polyuria/polydipsia/polyphagia   Neurologic: No  weakness, No  dizziness, No  slurred speech/focal weakness/facial droop  Psychiatric: No  concerns with depression, No  concerns with anxiety, No sleep problems, No mood problems  Exam:  BP 119/71   Pulse (!) 59   Temp 97.7 F (36.5 C) (Oral)   Ht 5\' 5"  (1.651 m)   Wt 237 lb (107.5 kg)   BMI 39.44 kg/m   Constitutional: VS see above. General Appearance: alert, well-developed, well-nourished, NAD  Eyes: Normal lids and conjunctive, non-icteric sclera  Ears, Nose, Mouth, Throat: MMM, Normal external inspection ears/nares/mouth/lips/gums. TM normal bilaterally. Pharynx/tonsils no erythema, no exudate. Nasal mucosa normal.   Neck: No masses, trachea midline. No thyroid enlargement. No tenderness/mass appreciated. No lymphadenopathy  Respiratory: Normal respiratory effort. no wheeze, no rhonchi, no rales  Cardiovascular: S1/S2 normal, no murmur, no rub/gallop auscultated. RRR. No lower extremity edema.   Gastrointestinal: Nontender, no masses. No hepatomegaly, no splenomegaly. No hernia appreciated. Bowel sounds normal. Rectal exam deferred.   Musculoskeletal: Gait normal. No clubbing/cyanosis of digits. Small cyst on anterior/medial R ankle at base of tibia, nonpainful, compressible   Neurological: Normal balance/coordination. No tremor. No cranial nerve deficit on limited exam. Motor and sensation intact and symmetric. Cerebellar reflexes intact.   Skin: warm, dry, intact. No rash/ulcer. No concerning nevi or subq nodules on limited exam.    Psychiatric: Normal judgment/insight. Normal mood and affect. Oriented x3.      ASSESSMENT/PLAN:   Annual physical exam - Plan: CBC, COMPLETE METABOLIC PANEL WITH GFR, Lipid panel  Gastroesophageal reflux disease without esophagitis - f/u GI as directed  Erectile dysfunction, unspecified erectile dysfunction type - Plan:  tadalafil (CIALIS) 20 MG tablet  Arthritis - CBD ok  - Plan: AMBULATORY NON FORMULARY MEDICATION  Chronic allergic rhinitis due to pollen - Plan: cetirizine (ZYRTEC) 10 MG tablet  Cyst of tendon sheath - R ankle - sports f/u if needed   Advance directive discussed with patient - addl info provided      MALE PREVENTIVE CARE  updated 02/18/18  ANNUAL SCREENING/COUNSELING  Any changes to health in the past year? no  Diet/Exercise - HEALTHY HABITS DISCUSSED TO DECREASE CV RISK Social History   Tobacco Use  Smoking Status Never Smoker  Smokeless Tobacco Never Used   Social History   Substance and Sexual Activity  Alcohol Use No   Depression screen PHQ 2/9 02/18/2018  Decreased Interest 0  Down, Depressed, Hopeless 0  PHQ - 2 Score 0  Altered sleeping 0  Tired, decreased energy 0  Change in appetite 0  Feeling bad or failure about yourself  0  Trouble concentrating 0  Moving slowly or fidgety/restless 0  Suicidal thoughts 0  PHQ-9 Score 0  Difficult doing work/chores Not difficult at all    [place]  Sexually active in the past year? -  Yes with male.  STI testing needed/desired today? - no  Any concerns with testosterone/libido? - no  INFECTIOUS DISEASE SCREENING  HIV - does not need  GC/CT - does not need  HepC - does not need  TB - does not need  CANCER SCREENING  Lung - USPSTF: 55-80yo w/ 30 py hx unless quit w/in 79yr - does not need  Colon - does not need  Prostate - does not need  OTHER DISEASE SCREENING  Lipid - needs  DM2 - needs  AAA - 65-75yo ever smoked: does not need  Osteoporosis - men 55yo+ - does not need  ADULT VACCINATION  Influenza - annual vaccine recommended  Td - booster every 10 years   Zoster - Shingrix recommended 72+ years old  PCV13 - was not indicated  PPSV23 - was not indicated Immunization History  Administered Date(s) Administered  . Influenza,inj,Quad PF,6+ Mos 08/21/2017  . Tdap  05/04/2016    OTHER  Fall - exercise and Vit D age 42+ - does not need  Consider ASA - age 21-59 - does not need      Visit summary with medication list and pertinent instructions was printed for patient to review. All questions at time of visit were answered - patient instructed to contact office with any additional concerns. ER/RTC precautions were reviewed with the patient.   Follow-up plan: Return in about 1 year (around 02/19/2019) for [place] - SOONER IF NEEDED / BASED ON LAB RESULTS .   Please note: voice recognition software was used to produce this document, and typos may escape review. Please contact Dr. Sheppard Coil for any needed clarifications.

## 2018-02-18 NOTE — Patient Instructions (Signed)
If cyst on ankle is not better or if it gets worse, I would recommend follow-up with one of our sports medicine specialists (Dr Georgina Snell or Dr. Patton Salles Dr. Darene Lamer) for further evaluation in 2-4 weeks. Just call our office and ask for an appointment for sports medicine!

## 2018-02-25 ENCOUNTER — Encounter: Payer: 59 | Admitting: Osteopathic Medicine

## 2018-03-13 ENCOUNTER — Other Ambulatory Visit: Payer: Self-pay | Admitting: Osteopathic Medicine

## 2018-03-13 DIAGNOSIS — N529 Male erectile dysfunction, unspecified: Secondary | ICD-10-CM

## 2018-03-13 NOTE — Telephone Encounter (Signed)
Snt to [facility] on file

## 2018-03-13 NOTE — Telephone Encounter (Signed)
[facility] sent note stating   "Alternative Requested:PATIENT IS REQUESTING TO TRY VIAGRA BECAUSE INSURANCE REQUIRES TO TRY FIRST."  RX was originally sent for Cialis.

## 2018-03-20 ENCOUNTER — Telehealth: Payer: Self-pay | Admitting: Osteopathic Medicine

## 2018-03-20 NOTE — Telephone Encounter (Signed)
New Message    We are going through our workqueue and I found the order for Thomas Gates to have a Exercise Tolerance Test (Dec 2018) does the patient still need to have this testing done? Please let me know and I will contact him to schedule this.

## 2018-03-20 NOTE — Telephone Encounter (Signed)
Stress test initially ordered for chest pain work-up, symptoms however were most likely due to acid reflux.  Can we call patient and just double check on this?  He did not mention any chest pain symptoms to me at his last visit but if he still desires stress test can go ahead and get it done.

## 2018-03-22 NOTE — Telephone Encounter (Signed)
Left pt detailed msg on ID'd VM. Asked that he call back and provide status update

## 2018-03-30 IMAGING — DX DG CHEST 2V
2 series · 2 of 2 positions shown · non-contrast
Comparison: None.

CLINICAL DATA: Cough with shortness of breath.

EXAM:
CHEST - 2 VIEW

[chest pa]
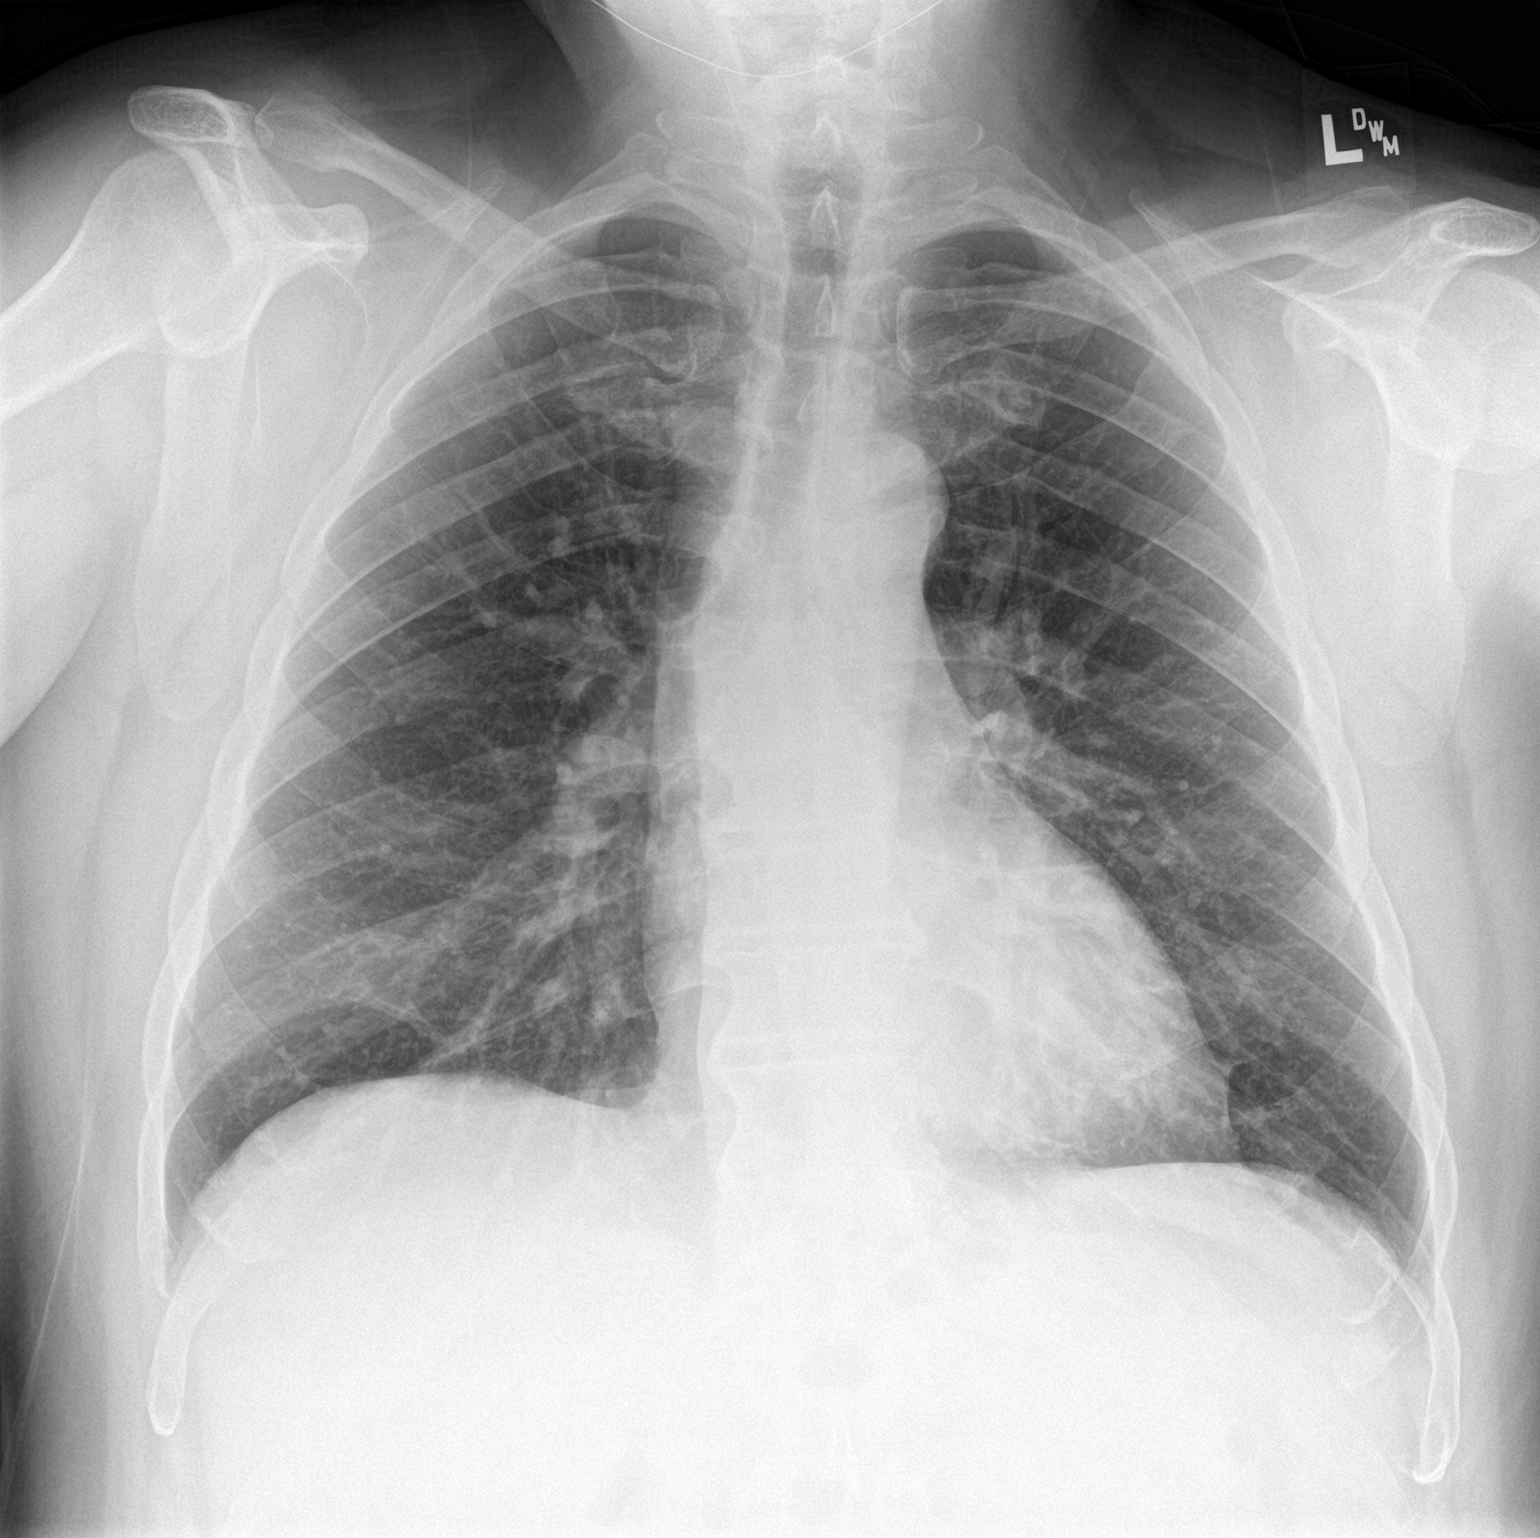

[chest lat]
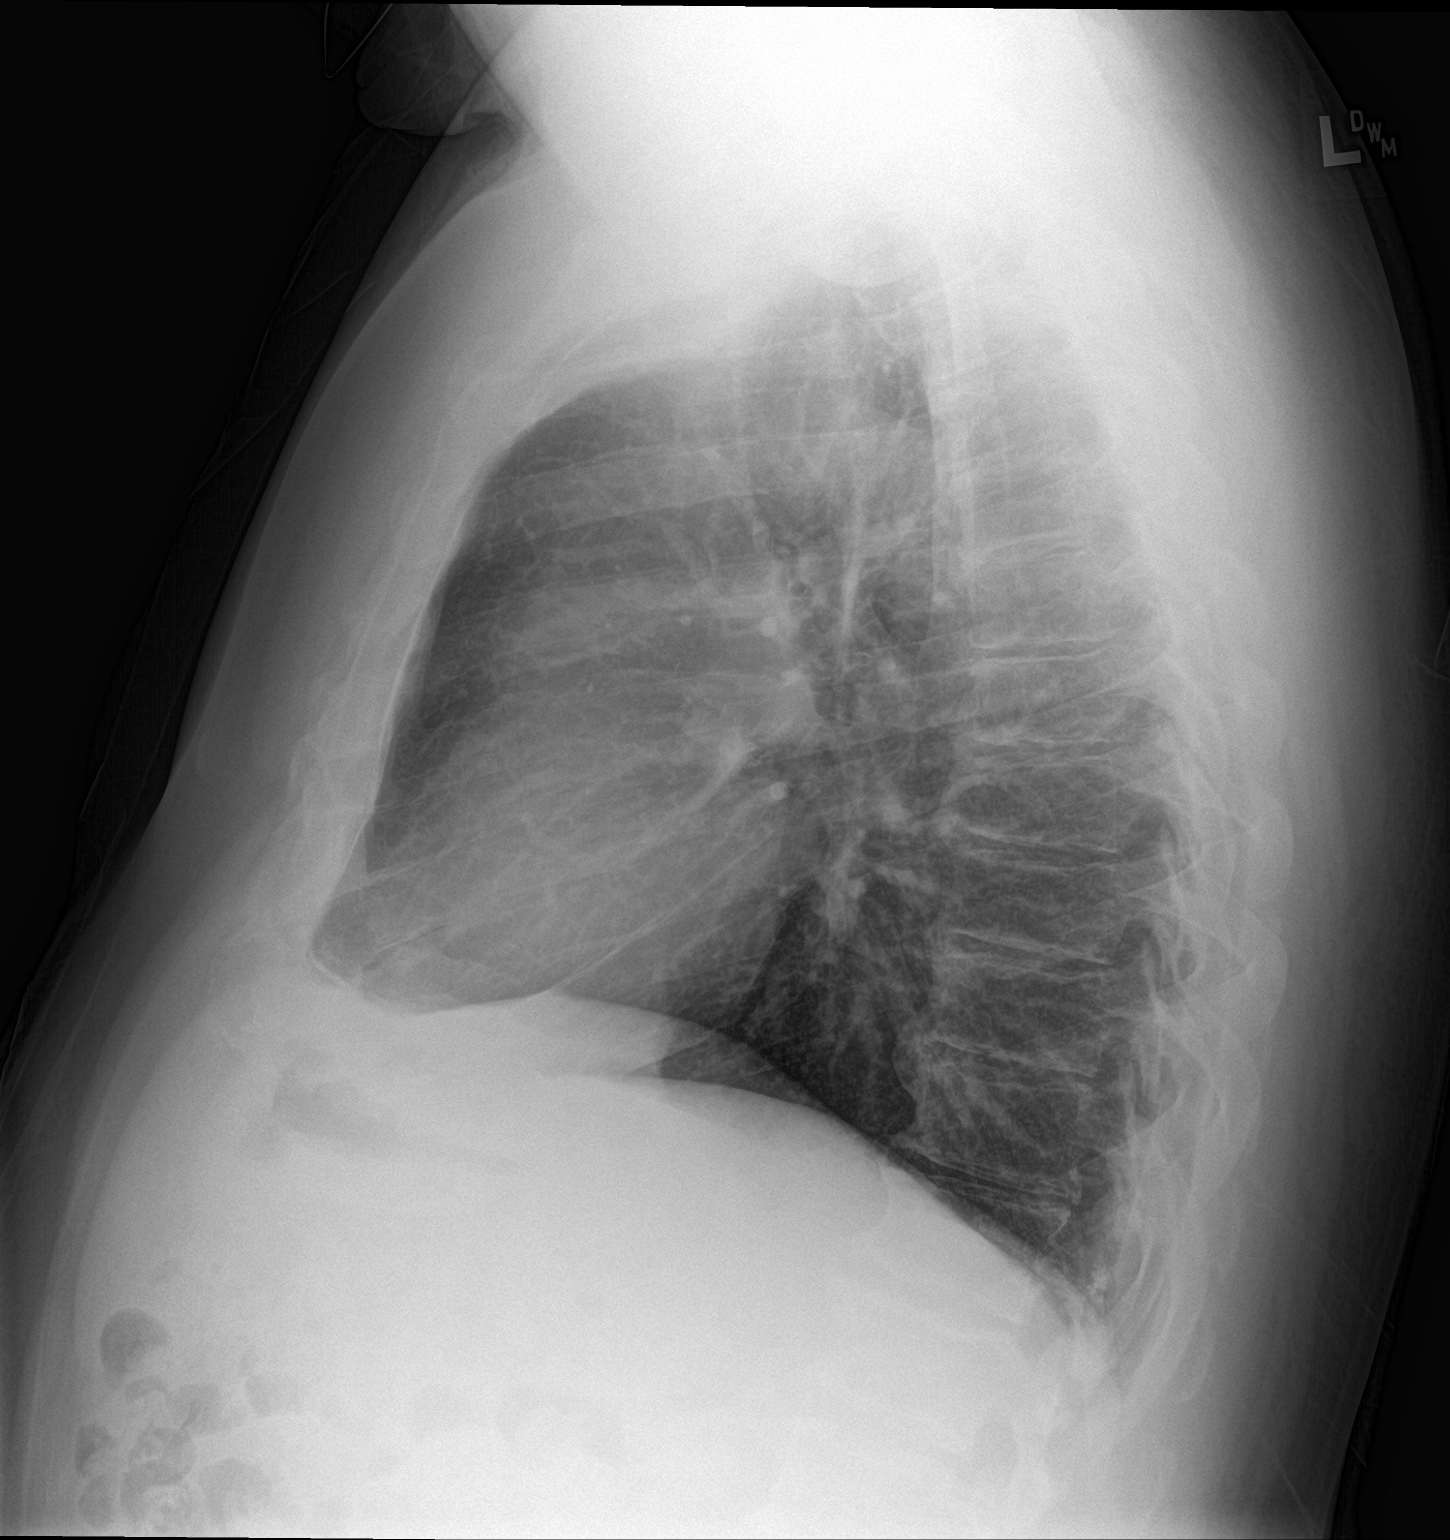

[2 of 2 positions shown; findings below may reference images not displayed]

FINDINGS: The lungs are clear without focal pneumonia, edema, pneumothorax or
pleural effusion. The cardiopericardial silhouette is within normal
limits for size. The visualized bony structures of the thorax are
intact.
IMPRESSION: No active cardiopulmonary disease.

## 2018-06-04 LAB — LIPID PANEL
CHOL/HDL RATIO: 3.7 (calc) (ref <5.0)
Cholesterol: 205 mg/dL — ABNORMAL HIGH (ref <200)
HDL: 55 mg/dL (ref >40)
LDL Cholesterol (Calc): 126 mg/dL (calc) — ABNORMAL HIGH
NON-HDL CHOLESTEROL (CALC): 150 mg/dL — AB (ref <130)
Triglycerides: 127 mg/dL (ref <150)

## 2018-06-04 LAB — COMPLETE METABOLIC PANEL WITH GFR
AG Ratio: 1.9 (calc) (ref 1.0–2.5)
ALBUMIN MSPROF: 4.3 g/dL (ref 3.6–5.1)
ALT: 16 U/L (ref 9–46)
AST: 14 U/L (ref 10–35)
Alkaline phosphatase (APISO): 66 U/L (ref 40–115)
BUN: 18 mg/dL (ref 7–25)
CALCIUM: 9.2 mg/dL (ref 8.6–10.3)
CO2: 28 mmol/L (ref 20–32)
CREATININE: 1.02 mg/dL (ref 0.70–1.33)
Chloride: 106 mmol/L (ref 98–110)
GFR, EST NON AFRICAN AMERICAN: 83 mL/min/{1.73_m2} (ref >=60)
GFR, Est African American: 96 mL/min/{1.73_m2} (ref >=60)
GLOBULIN: 2.3 g/dL (ref 1.9–3.7)
Glucose, Bld: 108 mg/dL — ABNORMAL HIGH (ref 65–99)
Potassium: 4.5 mmol/L (ref 3.5–5.3)
SODIUM: 141 mmol/L (ref 135–146)
Total Bilirubin: 0.5 mg/dL (ref 0.2–1.2)
Total Protein: 6.6 g/dL (ref 6.1–8.1)

## 2018-06-04 LAB — CBC
HEMATOCRIT: 38.8 % (ref 38.5–50.0)
HEMOGLOBIN: 13.3 g/dL (ref 13.2–17.1)
MCH: 29.8 pg (ref 27.0–33.0)
MCHC: 34.3 g/dL (ref 32.0–36.0)
MCV: 86.8 fL (ref 80.0–100.0)
MPV: 9.3 fL (ref 7.5–12.5)
Platelets: 254 10*3/uL (ref 140–400)
RBC: 4.47 10*6/uL (ref 4.20–5.80)
RDW: 12.2 % (ref 11.0–15.0)
WBC: 5.5 10*3/uL (ref 3.8–10.8)

## 2018-06-04 LAB — TEST AUTHORIZATION

## 2018-06-04 LAB — HEMOGLOBIN A1C W/OUT EAG: Hgb A1c MFr Bld: 4.9 % of total Hgb (ref <5.7)

## 2019-08-13 ENCOUNTER — Other Ambulatory Visit: Payer: Self-pay | Admitting: Internal Medicine

## 2019-11-17 ENCOUNTER — Ambulatory Visit (INDEPENDENT_AMBULATORY_CARE_PROVIDER_SITE_OTHER): Payer: No Typology Code available for payment source | Admitting: Osteopathic Medicine

## 2019-11-17 ENCOUNTER — Encounter: Payer: Self-pay | Admitting: Osteopathic Medicine

## 2019-11-17 ENCOUNTER — Other Ambulatory Visit: Payer: Self-pay

## 2019-11-17 VITALS — BP 130/86 | HR 90 | Temp 97.7°F | Wt 236.0 lb

## 2019-11-17 DIAGNOSIS — I8391 Asymptomatic varicose veins of right lower extremity: Secondary | ICD-10-CM

## 2019-11-17 DIAGNOSIS — Z86010 Personal history of colonic polyps: Secondary | ICD-10-CM

## 2019-11-17 DIAGNOSIS — I8390 Asymptomatic varicose veins of unspecified lower extremity: Secondary | ICD-10-CM | POA: Insufficient documentation

## 2019-11-17 DIAGNOSIS — Z125 Encounter for screening for malignant neoplasm of prostate: Secondary | ICD-10-CM | POA: Diagnosis not present

## 2019-11-17 DIAGNOSIS — Z0000 Encounter for general adult medical examination without abnormal findings: Secondary | ICD-10-CM | POA: Diagnosis not present

## 2019-11-17 NOTE — Assessment & Plan Note (Addendum)
Thomas Gates is a well-defined, movable mass on his anteromedial right ankle. It is nontender, it tends to enlarge with compression of his great saphenous vein suggesting that this is a varicosity. 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 My advice him is to wear compression hose, and we leave this alone for now.

## 2019-11-17 NOTE — Progress Notes (Signed)
    Procedures performed today:    Procedure: Diagnostic Ultrasound of right ankle Device: Samsung HS60  Findings: Noted hypoechoic rounded structure along the course of the great saphenous vein with good flow and no internal thrombus. Images permanently stored and available for review in the ultrasound unit.  Impression: Focal pseudoaneurysm/varicosity of the great saphenous vein at the level of the ankle joint, no internal thrombus.  Independent interpretation of notes and tests performed by another provider:   None.  Impression and Recommendations:    Varicose vein of right greater saphenous vein at the level of the ankle joint Dimarco is a well-defined, movable mass on his anteromedial right ankle. It is nontender, it tends to enlarge with compression of his great saphenous vein suggesting that this is a varicosity. 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 My advice him is to wear compression hose, and we leave this alone for now.    ___________________________________________ Gwen Her. Dianah Field, M.D., ABFM., CAQSM. Primary Care and [place] Instructor of [place] [facility] [employer]

## 2019-11-17 NOTE — Patient Instructions (Addendum)
General Preventive Care  Most recent routine screening lipids/other labs: ordered today   Everyone should have blood pressure checked once per year.   Tobacco: don't!   Alcohol: responsible moderation is ok for most adults - if you have concerns about your alcohol intake, please talk to me!   Exercise: as tolerated to reduce risk of cardiovascular disease and diabetes. Strength training will also prevent osteoporosis.   Mental health: if need for mental health care (medicines, counseling, other), or concerns about moods, please let me know!   Sexual health: if need for STD testing, or if concerns with libido/pain problems, please let me know!  Advanced Directive: Living Will and/or Healthcare Power of Attorney recommended for all adults, regardless of age or health.  Vaccines  Flu vaccine: recommended for almost everyone, every fall.   Shingles vaccine: Shingrix recommended after age 73.   Pneumonia vaccines: Prevnar and Pneumovax recommended after age 74, or sooner if certain medical conditions.  Tetanus booster: Tdap recommended every 10 years.   COVID: recommended as soon as you're eligible! Please follow [email or web address] for updates on eligibility and vaccination appointment. As of now, we do not anticipate our clinic having vaccines anytime soon.  Cancer screenings   Colon cancer screening: recommended for everyone at age 56-75  Prostate cancer screening: PSA blood test around age 73  Lung cancer screening: not needed for non-smokers Infection screenings . HIV, Gonorrhea/Chlamydia: screening as needed . Hepatitis C: recommended for anyone born 75-1963 . TB: certain at-risk populations, or depending on work requirements and/or travel history Other . Bone Density Test: recommended for men at age 11, sooner depending on risk factors . Abdominal Aortic Aneurysm: screening with ultrasound recommended once for men age 60-75 who have ever smoked

## 2019-11-17 NOTE — Progress Notes (Signed)
HPI: Thomas Gates is a 59 y.o. male who  has a past medical history of Allergy, Arthritis, CHF (congestive heart failure) ([place]), Chronic sinusitis, Deviated septum, Dysrhythmia, ED (erectile dysfunction), GERD (gastroesophageal reflux disease), adenomatous polyp of colon (03/02/2017), Hyperlipidemia, Nasal polyps, and Supraventricular tachycardia ([place]) (1999).  he presents to [facility] today, 11/17/19,  for chief complaint of: Annual physical     Patient here for annual physical / wellness exam.  See preventive care reviewed as below.          Past medical, surgical, social and family history reviewed:  Patient Active Problem List   Diagnosis Date Noted  . Varicose vein of right greater saphenous vein at the level of the ankle joint 11/17/2019  . History of esophagogastroduodenoscopy (EGD) 01/31/2018  . Cough 12/04/2017  . Allergic sinusitis 12/04/2017  . Burning chest pain 08/21/2017  . Hx of adenomatous polyp of colon 03/02/2017  . Gastroesophageal reflux disease without esophagitis 11/01/2016  . Deviated nasal septum 05/09/2013  . Sinusitis, chronic 05/09/2013  . Allergic rhinitis 01/08/2013    Past Surgical History:  Procedure Laterality Date  . COLONOSCOPY    . COLONOSCOPY W/ BIOPSIES    . HAND SURGERY  1999  . NASAL SEPTOPLASTY W/ TURBINOPLASTY Bilateral 05/09/2013   Procedure: NASAL SEPTOPLASTY AND BILATERAL INFERIOR TURBINATE REDUCTION, ;  Surgeon: Jerrell Belfast, MD;  Location: [place];  Service: ENT;  Laterality: Bilateral;  . SINUS ENDO W/FUSION Bilateral 05/09/2013   Procedure: BILATERAL ENDOSCOPIC SINUS SURGERY WITH FUSION NAVIGATION;  Surgeon: Jerrell Belfast, MD;  Location: [facility];  Service: ENT;  Laterality: Bilateral;  . SKIN GRAFT     right hand  . svt ablation    . UPPER GASTROINTESTINAL ENDOSCOPY      Social History   Tobacco Use  . Smoking status: Never Smoker  .  Smokeless tobacco: Never Used  Substance Use Topics  . Alcohol use: No    Family History  Problem Relation Age of Onset  . Heart attack Mother        smoker  . COPD Mother   . Hypertension Mother   . Emphysema Father        smoker  . Heart disease Father   . Colon cancer Neg Hx   . Stomach cancer Neg Hx   . Esophageal cancer Neg Hx   . Rectal cancer Neg Hx      Current medication list and allergy/intolerance information reviewed:    Current Outpatient Medications  Medication Sig Dispense Refill  . AMBULATORY NON FORMULARY MEDICATION CBD Oil BID    . cetirizine (ZYRTEC) 10 MG tablet Take 1 tablet (10 mg total) by mouth daily. 90 tablet 3  . fish oil-omega-3 fatty acids 1000 MG capsule Take 2 g by mouth daily.    . pantoprazole (PROTONIX) 40 MG tablet TAKE 1 TABLET (40 MG TOTAL) BY MOUTH 2 TIMES DAILY BEFORE A MEAL. 180 tablet 0  . sildenafil (VIAGRA) 50 MG tablet Take 0.5-2 tablets (25-100 mg total) by mouth as needed for erectile dysfunction (Use lowest effective dose). 30 tablet 5   No current facility-administered medications for this visit.    Allergies  Allergen Reactions  . Penicillins Shortness Of Breath    Rash also      Review of Systems:  Constitutional:  No  fever, no chills, No recent illness, No unintentional weight changes. No significant fatigue.   HEENT: No  headache, no vision change,  no hearing change, No sore throat, No  sinus pressure  Cardiac: No  chest pain, No  pressure, No palpitations, No  Orthopnea  Respiratory:  No  shortness of breath. No  Cough  Gastrointestinal: No  abdominal pain, No  nausea, No  vomiting,  No  blood in stool, No  diarrhea, No  constipation   Musculoskeletal: No new myalgia/arthralgia  Skin: No  Rash, No other wounds/concerning lesions  Neurologic: No  weakness, No  dizziness  Psychiatric: No  concerns with depression, No  concerns with anxiety, No sleep problems, No mood problems  Exam:  BP 130/86 (BP  Location: Left Arm, Patient Position: Sitting, Cuff Size: Large)   Pulse 90   Temp 97.7 F (36.5 C) (Oral)   Wt 236 lb 0.6 oz (107.1 kg)   BMI 39.28 kg/m   Constitutional: VS see above. General Appearance: alert, well-developed, well-nourished, NAD  Eyes: Normal lids and conjunctive, non-icteric sclera  Neck: No masses, trachea midline. No thyroid enlargement. No tenderness/mass appreciated. No lymphadenopathy  Respiratory: Normal respiratory effort. no wheeze, no rhonchi, no rales  Cardiovascular: S1/S2 normal, no murmur, no rub/gallop auscultated. RRR. No lower extremity edema.   Gastrointestinal: Nontender, no masses. No hepatomegaly, no splenomegaly. No hernia appreciated. Bowel sounds normal. Rectal exam deferred.   Musculoskeletal: Gait normal. No clubbing/cyanosis of digits.   Neurological: Normal balance/coordination. No tremor. No cranial nerve deficit on limited exam. Motor and sensation intact and symmetric. Cerebellar reflexes intact.   Skin: warm, dry, intact. No rash/ulcer. No concerning nevi or subq nodules on limited exam.    Psychiatric: Normal judgment/insight. Normal mood and affect. Oriented x3.    Results for orders placed or performed in visit on 11/17/19 (from the past 72 hour(s))  CBC     Status: None   Collection Time: 11/17/19  9:24 AM  Result Value Ref Range   WBC 7.5 3.8 - 10.8 Thousand/uL   RBC 5.05 4.20 - 5.80 Million/uL   Hemoglobin 15.2 13.2 - 17.1 g/dL   HCT 44.0 38.5 - 50.0 %   MCV 87.1 80.0 - 100.0 fL   MCH 30.1 27.0 - 33.0 pg   MCHC 34.5 32.0 - 36.0 g/dL   RDW 11.6 11.0 - 15.0 %   Platelets 285 140 - 400 Thousand/uL   MPV 9.2 7.5 - 12.5 fL  COMPLETE METABOLIC PANEL WITH GFR     Status: Abnormal   Collection Time: 11/17/19  9:24 AM  Result Value Ref Range   Glucose, Bld 100 (H) 65 - 99 mg/dL    Comment: .            Fasting reference interval . For someone without known diabetes, a glucose value between 100 and 125 mg/dL is  consistent with prediabetes and should be confirmed with a follow-up test. .    BUN 22 7 - 25 mg/dL   Creat 1.04 0.70 - 1.33 mg/dL    Comment: For patients >20 years of age, the reference limit for Creatinine is approximately 13% higher for people identified as African-American. .    GFR, Est Non African American 80 > OR = 60 mL/min/1.96m2   GFR, Est African American 93 > OR = 60 mL/min/1.90m2   BUN/Creatinine Ratio NOT APPLICABLE 6 - 22 (calc)   Sodium 141 135 - 146 mmol/L   Potassium 4.5 3.5 - 5.3 mmol/L   Chloride 104 98 - 110 mmol/L   CO2 31 20 - 32 mmol/L   Calcium 9.2 8.6 - 10.3 mg/dL  Total Protein 6.9 6.1 - 8.1 g/dL   Albumin 4.4 3.6 - 5.1 g/dL   Globulin 2.5 1.9 - 3.7 g/dL (calc)   AG Ratio 1.8 1.0 - 2.5 (calc)   Total Bilirubin 0.3 0.2 - 1.2 mg/dL   Alkaline phosphatase (APISO) 77 35 - 144 U/L   AST 20 10 - 35 U/L   ALT 23 9 - 46 U/L  Lipid panel     Status: Abnormal   Collection Time: 11/17/19  9:24 AM  Result Value Ref Range   Cholesterol 230 (H) <200 mg/dL   HDL 49 > OR = 40 mg/dL   Triglycerides 223 (H) <150 mg/dL    Comment: . If a non-fasting specimen was collected, consider repeat triglyceride testing on a fasting specimen if clinically indicated.  Yates Decamp et al. J. of Clin. Lipidol. [id number]. Marland Kitchen    LDL Cholesterol (Calc) 145 (H) mg/dL (calc)    Comment: Reference range: <100 . Desirable range <100 mg/dL for primary prevention;   <70 mg/dL for patients with CHD or diabetic patients  with > or = 2 CHD risk factors. Marland Kitchen LDL-C is now calculated using the Dearl-Hopkins  calculation, which is a validated novel method providing  better accuracy than the Friedewald equation in the  estimation of LDL-C.  Cresenciano Genre et al. Annamaria Helling. [id number]): 2061-2068  (http://education.[facility].com/faq/FAQ164)    Total CHOL/HDL Ratio 4.7 <5.0 (calc)   Non-HDL Cholesterol (Calc) 181 (H) <130 mg/dL (calc)    Comment: For patients with diabetes plus 1 major  ASCVD risk  factor, treating to a non-HDL-C goal of <100 mg/dL  (LDL-C of <70 mg/dL) is considered a therapeutic  option.     No results found.   ASSESSMENT/PLAN: The primary encounter diagnosis was Annual physical exam. Diagnoses of Hx of adenomatous polyp of colon, Prostate cancer screening, and Varicose veins of right lower extremity, unspecified whether complicated were also pertinent to this visit.  Appreciate consult from sports med - ?ganglion cyst vs other on R ankle - see Dr T notes    Orders Placed This Encounter  Procedures  . CBC  . COMPLETE METABOLIC PANEL WITH GFR  . Lipid panel  . PSA, Total with Reflex to PSA, Free    No orders of the defined types were placed in this encounter.   Patient Instructions  General Preventive Care  Most recent routine screening lipids/other labs: ordered today   Everyone should have blood pressure checked once per year.   Tobacco: don't!   Alcohol: responsible moderation is ok for most adults - if you have concerns about your alcohol intake, please talk to me!   Exercise: as tolerated to reduce risk of cardiovascular disease and diabetes. Strength training will also prevent osteoporosis.   Mental health: if need for mental health care (medicines, counseling, other), or concerns about moods, please let me know!   Sexual health: if need for STD testing, or if concerns with libido/pain problems, please let me know!  Advanced Directive: Living Will and/or Healthcare Power of Attorney recommended for all adults, regardless of age or health.  Vaccines  Flu vaccine: recommended for almost everyone, every fall.   Shingles vaccine: Shingrix recommended after age 42.   Pneumonia vaccines: Prevnar and Pneumovax recommended after age 76, or sooner if certain medical conditions.  Tetanus booster: Tdap recommended every 10 years.   COVID: recommended as soon as you're eligible! Please follow [email or web address] for updates on  eligibility and vaccination appointment. As of now, we do not anticipate our  clinic having vaccines anytime soon.  Cancer screenings   Colon cancer screening: recommended for everyone at age 45-75  Prostate cancer screening: PSA blood test around age 54  Lung cancer screening: not needed for non-smokers Infection screenings . HIV, Gonorrhea/Chlamydia: screening as needed . Hepatitis C: recommended for anyone born 53-1963 . TB: certain at-risk populations, or depending on work requirements and/or travel history Other . Bone Density Test: recommended for men at age 68, sooner depending on risk factors . Abdominal Aortic Aneurysm: screening with ultrasound recommended once for men age 57-75 who have ever smoked        Visit summary with medication list and pertinent instructions was printed for patient to review. All questions at time of visit were answered - patient instructed to contact office with any additional concerns or updates. ER/RTC precautions were reviewed with the patient.      Please note: voice recognition software was used to produce this document, and typos may escape review. Please contact Dr. Sheppard Coil for any needed clarifications.     Follow-up plan: Return in about 6 months (around 05/19/2020) for shingles vaccine #1, and ANNUAL PHYSICAL in 1 year (11/16/2020) pending lab results .

## 2019-11-18 LAB — COMPLETE METABOLIC PANEL WITH GFR
AG Ratio: 1.8 (calc) (ref 1.0–2.5)
ALT: 23 U/L (ref 9–46)
AST: 20 U/L (ref 10–35)
Albumin: 4.4 g/dL (ref 3.6–5.1)
Alkaline phosphatase (APISO): 77 U/L (ref 35–144)
BUN: 22 mg/dL (ref 7–25)
CO2: 31 mmol/L (ref 20–32)
Calcium: 9.2 mg/dL (ref 8.6–10.3)
Chloride: 104 mmol/L (ref 98–110)
Creat: 1.04 mg/dL (ref 0.70–1.33)
GFR, Est African American: 93 mL/min/{1.73_m2} (ref >=60)
GFR, Est Non African American: 80 mL/min/{1.73_m2} (ref >=60)
Globulin: 2.5 g/dL (calc) (ref 1.9–3.7)
Glucose, Bld: 100 mg/dL — ABNORMAL HIGH (ref 65–99)
Potassium: 4.5 mmol/L (ref 3.5–5.3)
Sodium: 141 mmol/L (ref 135–146)
Total Bilirubin: 0.3 mg/dL (ref 0.2–1.2)
Total Protein: 6.9 g/dL (ref 6.1–8.1)

## 2019-11-18 LAB — CBC
HCT: 44 % (ref 38.5–50.0)
Hemoglobin: 15.2 g/dL (ref 13.2–17.1)
MCH: 30.1 pg (ref 27.0–33.0)
MCHC: 34.5 g/dL (ref 32.0–36.0)
MCV: 87.1 fL (ref 80.0–100.0)
MPV: 9.2 fL (ref 7.5–12.5)
Platelets: 285 10*3/uL (ref 140–400)
RBC: 5.05 10*6/uL (ref 4.20–5.80)
RDW: 11.6 % (ref 11.0–15.0)
WBC: 7.5 10*3/uL (ref 3.8–10.8)

## 2019-11-18 LAB — LIPID PANEL
Cholesterol: 230 mg/dL — ABNORMAL HIGH (ref <200)
HDL: 49 mg/dL (ref >=40)
LDL Cholesterol (Calc): 145 mg/dL (calc) — ABNORMAL HIGH
Non-HDL Cholesterol (Calc): 181 mg/dL (calc) — ABNORMAL HIGH (ref <130)
Total CHOL/HDL Ratio: 4.7 (calc) (ref <5.0)
Triglycerides: 223 mg/dL — ABNORMAL HIGH (ref <150)

## 2019-11-18 LAB — PSA, TOTAL WITH REFLEX TO PSA, FREE: PSA, Total: 0.3 ng/mL (ref <=4.0)

## 2020-01-22 ENCOUNTER — Other Ambulatory Visit: Payer: Self-pay

## 2020-01-22 ENCOUNTER — Other Ambulatory Visit: Payer: Self-pay | Admitting: Internal Medicine

## 2020-01-22 ENCOUNTER — Encounter: Payer: Self-pay | Admitting: Osteopathic Medicine

## 2020-01-22 MED ORDER — PANTOPRAZOLE SODIUM 40 MG PO TBEC: DELAYED_RELEASE_TABLET | ORAL | 3 refills | Status: DC

## 2020-01-22 NOTE — Telephone Encounter (Signed)
Routing to provider. Rx written by historical provider. Rx pended.

## 2020-01-22 NOTE — Telephone Encounter (Signed)
Refilled at lower dose, 40 bid is excessive for long-term use unless Rx by GI

## 2020-02-03 ENCOUNTER — Other Ambulatory Visit: Payer: Self-pay

## 2020-02-03 MED ORDER — PANTOPRAZOLE SODIUM 40 MG PO TBEC: DELAYED_RELEASE_TABLET | ORAL | 3 refills | Status: DC

## 2020-10-01 ENCOUNTER — Telehealth: Payer: No Typology Code available for payment source | Admitting: Nurse Practitioner

## 2020-10-01 DIAGNOSIS — H5789 Other specified disorders of eye and adnexa: Secondary | ICD-10-CM

## 2020-10-01 DIAGNOSIS — L249 Irritant contact dermatitis, unspecified cause: Secondary | ICD-10-CM | POA: Diagnosis not present

## 2020-10-01 NOTE — Progress Notes (Signed)
Based on what you shared with me on the phone it looks like you have a chemical reaction to face and left eye.,that should be evaluated in a face to face office visit. Need to see eye doctor to make sure nothing is in your eye.    NOTE: If you entered your credit card information for this eVisit, you will not be charged. You may see a "hold" on your card for the $35 but that hold will drop off and you will not have a charge processed.  If you are having a true medical emergency please call 911.     For an urgent face to face visit, [facility] has four urgent care centers for your convenience:   . [facility]    [phone]                  Get Driving Directions  [address], [place] [address] . 10 am to 8 pm Monday-Friday . 12 pm to 8 pm Saturday-Sunday   . [facility] at [place]                  Get Driving Directions  [address], [place], [place] [address] . 8 am to 8 pm Monday-Friday . 9 am to 6 pm Saturday . 11 am to 6 pm Sunday   . [facility] at [place]                  Get Driving Directions   [address] [place], [place] [address] . 8 am to 8 pm Monday-Friday . 8 am to 4 pm Saturday-Sunday    . [facility] at [place]                    Get Driving Directions  [phone]  [address] [place], [place] [address]  . Monday-Friday, 12 PM to 6 PM    Your e-visit answers were reviewed by a board certified advanced clinical practitioner to complete your personal care plan.  Thank you for using e-Visits.

## 2021-01-12 ENCOUNTER — Encounter: Payer: Self-pay | Admitting: Osteopathic Medicine

## 2021-01-12 ENCOUNTER — Ambulatory Visit (INDEPENDENT_AMBULATORY_CARE_PROVIDER_SITE_OTHER): Payer: No Typology Code available for payment source | Admitting: Osteopathic Medicine

## 2021-01-12 ENCOUNTER — Other Ambulatory Visit: Payer: Self-pay

## 2021-01-12 VITALS — BP 144/92 | HR 76 | Temp 97.8°F | Wt 236.0 lb

## 2021-01-12 DIAGNOSIS — N529 Male erectile dysfunction, unspecified: Secondary | ICD-10-CM

## 2021-01-12 DIAGNOSIS — I10 Essential (primary) hypertension: Secondary | ICD-10-CM | POA: Diagnosis not present

## 2021-01-12 DIAGNOSIS — Z23 Encounter for immunization: Secondary | ICD-10-CM

## 2021-01-12 DIAGNOSIS — Z86010 Personal history of colonic polyps: Secondary | ICD-10-CM

## 2021-01-12 DIAGNOSIS — Z0000 Encounter for general adult medical examination without abnormal findings: Secondary | ICD-10-CM | POA: Diagnosis not present

## 2021-01-12 DIAGNOSIS — K219 Gastro-esophageal reflux disease without esophagitis: Secondary | ICD-10-CM

## 2021-01-12 DIAGNOSIS — J301 Allergic rhinitis due to pollen: Secondary | ICD-10-CM

## 2021-01-12 DIAGNOSIS — Z125 Encounter for screening for malignant neoplasm of prostate: Secondary | ICD-10-CM

## 2021-01-12 MED ORDER — PANTOPRAZOLE SODIUM 40 MG PO TBEC: 40.0000 mg | DELAYED_RELEASE_TABLET | Freq: Every day | ORAL | 3 refills | Status: DC

## 2021-01-12 MED ORDER — SILDENAFIL CITRATE 50 MG PO TABS: 25.0000 mg | ORAL_TABLET | ORAL | 5 refills | Status: AC | PRN

## 2021-01-12 NOTE — Patient Instructions (Addendum)
General Preventive Care  Most recent routine screening labs: ordered.   Blood pressure goal 130/80 or less.   Tobacco: don't!   Alcohol: responsible moderation is ok for most adults - if you have concerns about your alcohol intake, please talk to me!   Exercise: as tolerated to reduce risk of cardiovascular disease and diabetes. Strength training will also prevent osteoporosis.   Mental health: if need for mental health care (medicines, counseling, other), or concerns about moods, please let me know!   Sexual / Reproductive health: if need for STD testing, or if concerns with libido/pain problems, please let me know! If you need to discuss family planning, please let me know!   Advanced Directive: Living Will and/or Healthcare Power of Attorney recommended for all adults, regardless of age or health.  Vaccines  Flu vaccine: for almost everyone, every fall.   Shingles vaccine: recommended after age 80.   Pneumonia vaccines: after age 83  Tetanus booster: every 10 years - due 04/2026  COVID vaccine: THANKS for getting your vaccine! :) Cancer screenings   Colon cancer screening: Colonoscopy due 02/2022  Prostate cancer screening: PSA blood test age 10-71  Lung cancer screening: not needed for non-smokers Infection screenings  . HIV: recommended screening at least once age 22-65, more often as needed. . Gonorrhea/Chlamydia and other STI: screening as needed . Hepatitis C: recommended once for everyone age 36-75. Done 2017, repeat as needed . TB: certain at-risk populations, or depending on work requirements and/or travel history Other . Bone Density Test: recommended for men at age 28 . Abdominal Aortic Aneurysm: screening with ultrasound recommended once for men age 26-75 who have ever smoked 100+ cigarettes in their lifetime           Hypertension, Adult High blood pressure (hypertension) is when the force of blood pumping through the arteries is too strong. The  arteries are the blood vessels that carry blood from the heart throughout the body. Hypertension forces the heart to work harder to pump blood and may cause arteries to become narrow or stiff. Untreated or uncontrolled hypertension can cause a heart attack, heart failure, a stroke, kidney disease, and other problems. A blood pressure reading consists of a higher number over a lower number. Ideally, your blood pressure should be below 120/80. The first ("top") number is called the systolic pressure. It is a measure of the pressure in your arteries as your heart beats. The second ("bottom") number is called the diastolic pressure. It is a measure of the pressure in your arteries as the heart relaxes. What are the causes? The exact cause of this condition is not known. There are some conditions that result in or are related to high blood pressure. What increases the risk? Some risk factors for high blood pressure are under your control. The following factors may make you more likely to develop this condition:  Smoking.  Having type 2 diabetes mellitus, high cholesterol, or both.  Not getting enough exercise or physical activity.  Being overweight.  Having too much fat, sugar, calories, or salt (sodium) in your diet.  Drinking too much alcohol. Some risk factors for high blood pressure may be difficult or impossible to change. Some of these factors include:  Having chronic kidney disease.  Having a family history of high blood pressure.  Age. Risk increases with age.  Race. You may be at higher risk if you are African American.  Gender. Men are at higher risk than women before age 66. After  age 98, women are at higher risk than men.  Having obstructive sleep apnea.  Stress. What are the signs or symptoms? High blood pressure may not cause symptoms. Very high blood pressure (hypertensive crisis) may cause:  Headache.  Anxiety.  Shortness of breath.  Nosebleed.  Nausea and  vomiting.  Vision changes.  Severe chest pain.  Seizures. How is this diagnosed? This condition is diagnosed by measuring your blood pressure while you are seated, with your arm resting on a flat surface, your legs uncrossed, and your feet flat on the floor. The cuff of the blood pressure monitor will be placed directly against the skin of your upper arm at the level of your heart. It should be measured at least twice using the same arm. Certain conditions can cause a difference in blood pressure between your right and left arms. Certain factors can cause blood pressure readings to be lower or higher than normal for a short period of time:  When your blood pressure is higher when you are in a health care provider's office than when you are at home, this is called white coat hypertension. Most people with this condition do not need medicines.  When your blood pressure is higher at home than when you are in a health care provider's office, this is called masked hypertension. Most people with this condition may need medicines to control blood pressure. If you have a high blood pressure reading during one visit or you have normal blood pressure with other risk factors, you may be asked to:  Return on a different day to have your blood pressure checked again.  Monitor your blood pressure at home for 1 week or longer. If you are diagnosed with hypertension, you may have other blood or imaging tests to help your health care provider understand your overall risk for other conditions. How is this treated? This condition is treated by making healthy lifestyle changes, such as eating healthy foods, exercising more, and reducing your alcohol intake. Your health care provider may prescribe medicine if lifestyle changes are not enough to get your blood pressure under control, and if:  Your systolic blood pressure is above 130.  Your diastolic blood pressure is above 80. Your personal target blood  pressure may vary depending on your medical conditions, your age, and other factors. Follow these instructions at home: Eating and drinking  Eat a diet that is high in fiber and potassium, and low in sodium, added sugar, and fat. An example eating plan is called the DASH (Dietary Approaches to Stop Hypertension) diet. To eat this way: ? Eat plenty of fresh fruits and vegetables. Try to fill one half of your plate at each meal with fruits and vegetables. ? Eat whole grains, such as whole-wheat pasta, brown rice, or whole-grain bread. Fill about one fourth of your plate with whole grains. ? Eat or drink low-fat dairy products, such as skim milk or low-fat yogurt. ? Avoid fatty cuts of meat, processed or cured meats, and poultry with skin. Fill about one fourth of your plate with lean proteins, such as fish, chicken without skin, beans, eggs, or tofu. ? Avoid pre-made and processed foods. These tend to be higher in sodium, added sugar, and fat.  Reduce your daily sodium intake. Most people with hypertension should eat less than 1,500 mg of sodium a day.  Do not drink alcohol if: ? Your health care provider tells you not to drink. ? You are pregnant, may be pregnant, or are planning  to become pregnant.  If you drink alcohol: ? Limit how much you use to:  0-1 drink a day for women.  0-2 drinks a day for men. ? Be aware of how much alcohol is in your drink. In the [place] one drink equals one 12 oz bottle of beer (355 mL), one 5 oz glass of wine (148 mL), or one 1 oz glass of hard liquor (44 mL).   Lifestyle  Work with your health care provider to maintain a healthy body weight or to lose weight. Ask what an ideal weight is for you.  Get at least 30 minutes of exercise most days of the week. Activities may include walking, swimming, or biking.  Include exercise to strengthen your muscles (resistance exercise), such as Pilates or lifting weights, as part of your weekly exercise routine. Try to  do these types of exercises for 30 minutes at least 3 days a week.  Do not use any products that contain nicotine or tobacco, such as cigarettes, e-cigarettes, and chewing tobacco. If you need help quitting, ask your health care provider.  Monitor your blood pressure at home as told by your health care provider.  Keep all follow-up visits as told by your health care provider. This is important.   Medicines  Take over-the-counter and prescription medicines only as told by your health care provider. Follow directions carefully. Blood pressure medicines must be taken as prescribed.  Do not skip doses of blood pressure medicine. Doing this puts you at risk for problems and can make the medicine less effective.  Ask your health care provider about side effects or reactions to medicines that you should watch for. Contact a health care provider if you:  Think you are having a reaction to a medicine you are taking.  Have headaches that keep coming back (recurring).  Feel dizzy.  Have swelling in your ankles.  Have trouble with your vision. Get help right away if you:  Develop a severe headache or confusion.  Have unusual weakness or numbness.  Feel faint.  Have severe pain in your chest or abdomen.  Vomit repeatedly.  Have trouble breathing. Summary  Hypertension is when the force of blood pumping through your arteries is too strong. If this condition is not controlled, it may put you at risk for serious complications.  Your personal target blood pressure may vary depending on your medical conditions, your age, and other factors. For most people, a normal blood pressure is less than 120/80.  Hypertension is treated with lifestyle changes, medicines, or a combination of both. Lifestyle changes include losing weight, eating a healthy, low-sodium diet, exercising more, and limiting alcohol. This information is not intended to replace advice given to you by your health care provider.  Make sure you discuss any questions you have with your health care provider. Document Revised: 05/01/2018 Document Reviewed: 05/01/2018 [employer] Patient Education  2021 [employer].

## 2021-01-12 NOTE — Progress Notes (Signed)
Thomas Gates is a 60 y.o. male who presents to  [place] at [facility]  today, 01/12/21, seeking care for the following:  . Annual physical . BP elevated today      ASSESSMENT & PLAN with other pertinent findings:  The primary encounter diagnosis was Annual physical exam. Diagnoses of Hypertension, unspecified type, Need for shingles vaccine, Erectile dysfunction, unspecified erectile dysfunction type, Gastroesophageal reflux disease without esophagitis, Allergic rhinitis due to pollen, unspecified seasonality, Hx of adenomatous polyp of colon, and Prostate cancer screening were also pertinent to this visit.   We discussed monitoring BP at home, Rx if consistently higher than 140/90or would also consider if >130/80   Patient Instructions   General Preventive Care  Most recent routine screening labs: ordered.   Blood pressure goal 130/80 or less.   Tobacco: don't!   Alcohol: responsible moderation is ok for most adults - if you have concerns about your alcohol intake, please talk to me!   Exercise: as tolerated to reduce risk of cardiovascular disease and diabetes. Strength training will also prevent osteoporosis.   Mental health: if need for mental health care (medicines, counseling, other), or concerns about moods, please let me know!   Sexual / Reproductive health: if need for STD testing, or if concerns with libido/pain problems, please let me know! If you need to discuss family planning, please let me know!   Advanced Directive: Living Will and/or Healthcare Power of Attorney recommended for all adults, regardless of age or health.  Vaccines  Flu vaccine: for almost everyone, every fall.   Shingles vaccine: recommended after age 69.   Pneumonia vaccines: after age 55  Tetanus booster: every 10 years - due 04/2026  COVID vaccine: THANKS for getting your vaccine! :) Cancer screenings   Colon cancer screening:  Colonoscopy due 02/2022  Prostate cancer screening: PSA blood test age 82-71  Lung cancer screening: not needed for non-smokers Infection screenings  . HIV: recommended screening at least once age 53-65, more often as needed. . Gonorrhea/Chlamydia and other STI: screening as needed . Hepatitis C: recommended once for everyone age 81-75. Done 2017, repeat as needed . TB: certain at-risk populations, or depending on work requirements and/or travel history Other . Bone Density Test: recommended for men at age 85 . Abdominal Aortic Aneurysm: screening with ultrasound recommended once for men age 33-75 who have ever smoked 100+ cigarettes in their lifetime           Hypertension, Adult High blood pressure (hypertension) is when the force of blood pumping through the arteries is too strong. The arteries are the blood vessels that carry blood from the heart throughout the body. Hypertension forces the heart to work harder to pump blood and may cause arteries to become narrow or stiff. Untreated or uncontrolled hypertension can cause a heart attack, heart failure, a stroke, kidney disease, and other problems. A blood pressure reading consists of a higher number over a lower number. Ideally, your blood pressure should be below 120/80. The first ("top") number is called the systolic pressure. It is a measure of the pressure in your arteries as your heart beats. The second ("bottom") number is called the diastolic pressure. It is a measure of the pressure in your arteries as the heart relaxes. What are the causes? The exact cause of this condition is not known. There are some conditions that result in or are related to high blood pressure. What increases the risk? Some risk  factors for high blood pressure are under your control. The following factors may make you more likely to develop this condition:  Smoking.  Having type 2 diabetes mellitus, high cholesterol, or both.  Not getting enough  exercise or physical activity.  Being overweight.  Having too much fat, sugar, calories, or salt (sodium) in your diet.  Drinking too much alcohol. Some risk factors for high blood pressure may be difficult or impossible to change. Some of these factors include:  Having chronic kidney disease.  Having a family history of high blood pressure.  Age. Risk increases with age.  Race. You may be at higher risk if you are African American.  Gender. Men are at higher risk than women before age 37. After age 33, women are at higher risk than men.  Having obstructive sleep apnea.  Stress. What are the signs or symptoms? High blood pressure may not cause symptoms. Very high blood pressure (hypertensive crisis) may cause:  Headache.  Anxiety.  Shortness of breath.  Nosebleed.  Nausea and vomiting.  Vision changes.  Severe chest pain.  Seizures. How is this diagnosed? This condition is diagnosed by measuring your blood pressure while you are seated, with your arm resting on a flat surface, your legs uncrossed, and your feet flat on the floor. The cuff of the blood pressure monitor will be placed directly against the skin of your upper arm at the level of your heart. It should be measured at least twice using the same arm. Certain conditions can cause a difference in blood pressure between your right and left arms. Certain factors can cause blood pressure readings to be lower or higher than normal for a short period of time:  When your blood pressure is higher when you are in a health care provider's office than when you are at home, this is called white coat hypertension. Most people with this condition do not need medicines.  When your blood pressure is higher at home than when you are in a health care provider's office, this is called masked hypertension. Most people with this condition may need medicines to control blood pressure. If you have a high blood pressure reading during  one visit or you have normal blood pressure with other risk factors, you may be asked to:  Return on a different day to have your blood pressure checked again.  Monitor your blood pressure at home for 1 week or longer. If you are diagnosed with hypertension, you may have other blood or imaging tests to help your health care provider understand your overall risk for other conditions. How is this treated? This condition is treated by making healthy lifestyle changes, such as eating healthy foods, exercising more, and reducing your alcohol intake. Your health care provider may prescribe medicine if lifestyle changes are not enough to get your blood pressure under control, and if:  Your systolic blood pressure is above 130.  Your diastolic blood pressure is above 80. Your personal target blood pressure may vary depending on your medical conditions, your age, and other factors. Follow these instructions at home: Eating and drinking  Eat a diet that is high in fiber and potassium, and low in sodium, added sugar, and fat. An example eating plan is called the DASH (Dietary Approaches to Stop Hypertension) diet. To eat this way: ? Eat plenty of fresh fruits and vegetables. Try to fill one half of your plate at each meal with fruits and vegetables. ? Eat whole grains, such as whole-wheat  pasta, brown rice, or whole-grain bread. Fill about one fourth of your plate with whole grains. ? Eat or drink low-fat dairy products, such as skim milk or low-fat yogurt. ? Avoid fatty cuts of meat, processed or cured meats, and poultry with skin. Fill about one fourth of your plate with lean proteins, such as fish, chicken without skin, beans, eggs, or tofu. ? Avoid pre-made and processed foods. These tend to be higher in sodium, added sugar, and fat.  Reduce your daily sodium intake. Most people with hypertension should eat less than 1,500 mg of sodium a day.  Do not drink alcohol if: ? Your health care provider  tells you not to drink. ? You are pregnant, may be pregnant, or are planning to become pregnant.  If you drink alcohol: ? Limit how much you use to:  0-1 drink a day for women.  0-2 drinks a day for men. ? Be aware of how much alcohol is in your drink. In the [place] one drink equals one 12 oz bottle of beer (355 mL), one 5 oz glass of wine (148 mL), or one 1 oz glass of hard liquor (44 mL).   Lifestyle  Work with your health care provider to maintain a healthy body weight or to lose weight. Ask what an ideal weight is for you.  Get at least 30 minutes of exercise most days of the week. Activities may include walking, swimming, or biking.  Include exercise to strengthen your muscles (resistance exercise), such as Pilates or lifting weights, as part of your weekly exercise routine. Try to do these types of exercises for 30 minutes at least 3 days a week.  Do not use any products that contain nicotine or tobacco, such as cigarettes, e-cigarettes, and chewing tobacco. If you need help quitting, ask your health care provider.  Monitor your blood pressure at home as told by your health care provider.  Keep all follow-up visits as told by your health care provider. This is important.   Medicines  Take over-the-counter and prescription medicines only as told by your health care provider. Follow directions carefully. Blood pressure medicines must be taken as prescribed.  Do not skip doses of blood pressure medicine. Doing this puts you at risk for problems and can make the medicine less effective.  Ask your health care provider about side effects or reactions to medicines that you should watch for. Contact a health care provider if you:  Think you are having a reaction to a medicine you are taking.  Have headaches that keep coming back (recurring).  Feel dizzy.  Have swelling in your ankles.  Have trouble with your vision. Get help right away if you:  Develop a severe headache or  confusion.  Have unusual weakness or numbness.  Feel faint.  Have severe pain in your chest or abdomen.  Vomit repeatedly.  Have trouble breathing. Summary  Hypertension is when the force of blood pumping through your arteries is too strong. If this condition is not controlled, it may put you at risk for serious complications.  Your personal target blood pressure may vary depending on your medical conditions, your age, and other factors. For most people, a normal blood pressure is less than 120/80.  Hypertension is treated with lifestyle changes, medicines, or a combination of both. Lifestyle changes include losing weight, eating a healthy, low-sodium diet, exercising more, and limiting alcohol. This information is not intended to replace advice given to you by your health care provider. Make  sure you discuss any questions you have with your health care provider. Document Revised: 05/01/2018 Document Reviewed: 05/01/2018 Elsevier Patient Education  2021 [employer].    Orders Placed This Encounter  Procedures  . Varicella-zoster vaccine IM (Shingrix)  . CBC  . COMPLETE METABOLIC PANEL WITH GFR  . Lipid panel  . TSH  . PSA, Total with Reflex to PSA, Free    Meds ordered this encounter  Medications  . pantoprazole (PROTONIX) 40 MG tablet    Sig: Take 1 tablet (40 mg total) by mouth daily.    Dispense:  90 tablet    Refill:  3  . sildenafil (VIAGRA) 50 MG tablet    Sig: Take 0.5-2 tablets (25-100 mg total) by mouth as needed for erectile dysfunction (Use lowest effective dose).    Dispense:  30 tablet    Refill:  5     See below for relevant physical exam findings  See below for recent lab and imaging results reviewed  Medications, allergies, PMH, PSH, SocH, FamH reviewed below    Follow-up instructions: Return for annual in 1 year. Nurse visit f/r Shingrix #2 vaccine in 2-6 mos from today  .                                        Exam:  BP (!) 144/92 (BP Location: Left Arm, Patient Position: Sitting, Cuff Size: Large)   Pulse 76   Temp 97.8 F (36.6 C) (Oral)   Wt 236 lb (107 kg)   BMI 39.27 kg/m   Constitutional: VS see above. General Appearance: alert, well-developed, well-nourished, NAD  Neck: No masses, trachea midline.   Respiratory: Normal respiratory effort. no wheeze, no rhonchi, no rales  Cardiovascular: S1/S2 normal, no murmur, no rub/gallop auscultated. RRR.   Musculoskeletal: Gait normal. Symmetric and independent movement of all extremities  Abdominal: non-tender, non-distended, no appreciable organomegaly, neg Murphy's, BS WNLx4  Neurological: Normal balance/coordination. No tremor.  Skin: warm, dry, intact.   Psychiatric: Normal judgment/insight. Normal mood and affect. Oriented x3.    BP Readings from Last 3 Encounters:  01/12/21 (!) 144/92  11/17/19 130/86  02/18/18 119/71    Current Meds  Medication Sig  . AMBULATORY NON FORMULARY MEDICATION CBD Oil BID  . cetirizine (ZYRTEC) 10 MG tablet Take 1 tablet (10 mg total) by mouth daily.  . fish oil-omega-3 fatty acids 1000 MG capsule Take 2 g by mouth daily.  . [DISCONTINUED] pantoprazole (PROTONIX) 40 MG tablet TAKE 1 TABLET (40 MG TOTAL) BY MOUTH DAILY BEFORE A MEAL.  . [DISCONTINUED] sildenafil (VIAGRA) 50 MG tablet Take 0.5-2 tablets (25-100 mg total) by mouth as needed for erectile dysfunction (Use lowest effective dose).    Allergies  Allergen Reactions  . Penicillins Shortness Of Breath    Rash also    Patient Active Problem List   Diagnosis Date Noted  . Varicose vein of right greater saphenous vein at the level of the ankle joint 11/17/2019  . History of esophagogastroduodenoscopy (EGD) 01/31/2018  . Cough 12/04/2017  . Allergic sinusitis 12/04/2017  . Burning chest pain 08/21/2017  . Hx of adenomatous polyp of colon 03/02/2017  .  Gastroesophageal reflux disease without esophagitis 11/01/2016  . Deviated nasal septum 05/09/2013  . Sinusitis, chronic 05/09/2013  . Allergic rhinitis 01/08/2013    Family History  Problem Relation Age of Onset  . Heart attack Mother  smoker  . COPD Mother   . Hypertension Mother   . Emphysema Father        smoker  . Heart disease Father   . Colon cancer Neg Hx   . Stomach cancer Neg Hx   . Esophageal cancer Neg Hx   . Rectal cancer Neg Hx     Social History   Tobacco Use  Smoking Status Never Smoker  Smokeless Tobacco Never Used    Past Surgical History:  Procedure Laterality Date  . COLONOSCOPY    . COLONOSCOPY W/ BIOPSIES    . HAND SURGERY  1999  . NASAL SEPTOPLASTY W/ TURBINOPLASTY Bilateral 05/09/2013   Procedure: NASAL SEPTOPLASTY AND BILATERAL INFERIOR TURBINATE REDUCTION, ;  Surgeon: Jerrell Belfast, MD;  Location: [place];  Service: ENT;  Laterality: Bilateral;  . SINUS ENDO W/FUSION Bilateral 05/09/2013   Procedure: BILATERAL ENDOSCOPIC SINUS SURGERY WITH FUSION NAVIGATION;  Surgeon: Jerrell Belfast, MD;  Location: [place];  Service: ENT;  Laterality: Bilateral;  . SKIN GRAFT     right hand  . svt ablation    . UPPER GASTROINTESTINAL ENDOSCOPY      Immunization History  Administered Date(s) Administered  . Influenza,inj,Quad PF,6+ Mos 08/21/2017  . PFIZER(Purple Top)SARS-COV-2 Vaccination 11/06/2019, 12/12/2019, 09/01/2020  . Tdap 05/04/2016  . Zoster Recombinat (Shingrix) 01/12/2021    No results found for this or any previous visit (from the past 2160 hour(s)).  No results found.     All questions at time of visit were answered - patient instructed to contact office with any additional concerns or updates. ER/RTC precautions were reviewed with the patient as applicable.   Please note: manual typing as well as voice recognition software may have been used to produce this document - typos may escape review.  Please contact Dr. Sheppard Coil for any needed clarifications.

## 2021-01-15 LAB — CBC
HCT: 43.3 % (ref 38.5–50.0)
Hemoglobin: 14.5 g/dL (ref 13.2–17.1)
MCH: 29.5 pg (ref 27.0–33.0)
MCHC: 33.5 g/dL (ref 32.0–36.0)
MCV: 88.2 fL (ref 80.0–100.0)
MPV: 9.4 fL (ref 7.5–12.5)
Platelets: 327 10*3/uL (ref 140–400)
RBC: 4.91 10*6/uL (ref 4.20–5.80)
RDW: 12 % (ref 11.0–15.0)
WBC: 7.4 10*3/uL (ref 3.8–10.8)

## 2021-01-15 LAB — COMPLETE METABOLIC PANEL WITH GFR
AG Ratio: 1.6 (calc) (ref 1.0–2.5)
ALT: 19 U/L (ref 9–46)
AST: 17 U/L (ref 10–35)
Albumin: 4.5 g/dL (ref 3.6–5.1)
Alkaline phosphatase (APISO): 81 U/L (ref 35–144)
BUN: 19 mg/dL (ref 7–25)
CO2: 29 mmol/L (ref 20–32)
Calcium: 9.6 mg/dL (ref 8.6–10.3)
Chloride: 104 mmol/L (ref 98–110)
Creat: 1.11 mg/dL (ref 0.70–1.33)
GFR, Est African American: 85 mL/min/{1.73_m2} (ref >=60)
GFR, Est Non African American: 73 mL/min/{1.73_m2} (ref >=60)
Globulin: 2.8 g/dL (calc) (ref 1.9–3.7)
Glucose, Bld: 100 mg/dL — ABNORMAL HIGH (ref 65–99)
Potassium: 4.6 mmol/L (ref 3.5–5.3)
Sodium: 141 mmol/L (ref 135–146)
Total Bilirubin: 0.5 mg/dL (ref 0.2–1.2)
Total Protein: 7.3 g/dL (ref 6.1–8.1)

## 2021-01-15 LAB — LIPID PANEL
Cholesterol: 217 mg/dL — ABNORMAL HIGH (ref <200)
HDL: 53 mg/dL (ref >=40)
LDL Cholesterol (Calc): 133 mg/dL (calc) — ABNORMAL HIGH
Non-HDL Cholesterol (Calc): 164 mg/dL (calc) — ABNORMAL HIGH (ref <130)
Total CHOL/HDL Ratio: 4.1 (calc) (ref <5.0)
Triglycerides: 174 mg/dL — ABNORMAL HIGH (ref <150)

## 2021-01-15 LAB — TSH: TSH: 1.96 mIU/L (ref 0.40–4.50)

## 2021-01-15 LAB — PSA, TOTAL WITH REFLEX TO PSA, FREE: PSA, Total: 0.4 ng/mL (ref <=4.0)

## 2021-01-15 LAB — HEMOGLOBIN A1C W/OUT EAG: Hgb A1c MFr Bld: 5.1 % of total Hgb (ref <5.7)

## 2021-03-29 ENCOUNTER — Ambulatory Visit (INDEPENDENT_AMBULATORY_CARE_PROVIDER_SITE_OTHER): Payer: No Typology Code available for payment source | Admitting: Osteopathic Medicine

## 2021-03-29 ENCOUNTER — Other Ambulatory Visit: Payer: Self-pay

## 2021-03-29 VITALS — BP 131/78 | HR 76 | Temp 98.6°F

## 2021-03-29 DIAGNOSIS — Z23 Encounter for immunization: Secondary | ICD-10-CM | POA: Diagnosis not present

## 2021-03-29 NOTE — Progress Notes (Signed)
Pt here for Shingrix vaccine.  Screening questionnaire reviewed, VIS provided to patient, and any/all patient questions answered.  Charyl Bigger, CMA

## 2021-08-04 ENCOUNTER — Ambulatory Visit: Payer: No Typology Code available for payment source | Admitting: Medical-Surgical

## 2021-08-14 ENCOUNTER — Encounter: Payer: Self-pay | Admitting: Emergency Medicine

## 2021-08-14 ENCOUNTER — Emergency Department (INDEPENDENT_AMBULATORY_CARE_PROVIDER_SITE_OTHER): Payer: No Typology Code available for payment source

## 2021-08-14 ENCOUNTER — Emergency Department
Admission: EM | Admit: 2021-08-14 | Discharge: 2021-08-14 | Disposition: A | Discharge status: Home / Self Care | Payer: No Typology Code available for payment source | Source: Home / Self Care | Attending: Family Medicine | Admitting: Family Medicine

## 2021-08-14 ENCOUNTER — Other Ambulatory Visit: Payer: Self-pay

## 2021-08-14 DIAGNOSIS — J209 Acute bronchitis, unspecified: Secondary | ICD-10-CM | POA: Diagnosis not present

## 2021-08-14 DIAGNOSIS — R059 Cough, unspecified: Secondary | ICD-10-CM | POA: Diagnosis not present

## 2021-08-14 MED ORDER — PREDNISONE 20 MG PO TABS: 20.0000 mg | ORAL_TABLET | Freq: Two times a day (BID) | ORAL | 0 refills | Status: DC

## 2021-08-14 MED ORDER — AZITHROMYCIN 250 MG PO TABS: ORAL_TABLET | ORAL | 0 refills | Status: DC

## 2021-08-14 MED ORDER — BENZONATATE 200 MG PO CAPS: 200.0000 mg | ORAL_CAPSULE | Freq: Three times a day (TID) | ORAL | 0 refills | Status: DC | PRN

## 2021-08-14 NOTE — ED Triage Notes (Signed)
Cough x 1 month  Pt thinks he may have had flu about 3 weeks ago  OTC  mucinex Cough is worse at night  Negative Home COVID test 3 weeks ago - never actually tested for the flu

## 2021-08-14 NOTE — ED Provider Notes (Signed)
[facility]    CSN: [id number] Arrival date & time: 08/14/21  1326      History   Chief Complaint Chief Complaint  Patient presents with   Cough    HPI Thomas Gates is a 60 y.o. male.   HPI  Patient states that he had a flu about 3 weeks ago.  He has been coughing ever since then.  He has a cough that is productive.  His chest hurts from the coughing.  No shortness of breath.  He is feeling somewhat tired.  Cough is worse at night.  He is tried a number of over-the-counter medicines which have not really helped.  He did have a home COVID test that was negative.  His wife is a Marine scientist he does not have any underlying asthma or COPD.  He does have environmental allergies  Past Medical History:  Diagnosis Date   Allergy    Arthritis    rt hand   CHF (congestive heart failure) (HCC)    hx SVT, dx CHF while in hospital for that ?due to morphine or IV fluids    Chronic sinusitis    Deviated septum    Dysrhythmia    SVT   ED (erectile dysfunction)    GERD (gastroesophageal reflux disease)    Hx of adenomatous polyp of colon 03/02/2017   Hyperlipidemia    Nasal polyps    Supraventricular tachycardia ([place]) 1999    Patient Active Problem List   Diagnosis Date Noted   Varicose vein of right greater saphenous vein at the level of the ankle joint 11/17/2019   History of esophagogastroduodenoscopy (EGD) 01/31/2018   Cough 12/04/2017   Allergic sinusitis 12/04/2017   Burning chest pain 08/21/2017   Hx of adenomatous polyp of colon 03/02/2017   Gastroesophageal reflux disease without esophagitis 11/01/2016   Deviated nasal septum 05/09/2013   Sinusitis, chronic 05/09/2013   Allergic rhinitis 01/08/2013    Past Surgical History:  Procedure Laterality Date   COLONOSCOPY     COLONOSCOPY W/ BIOPSIES     HAND SURGERY  1999   NASAL SEPTOPLASTY W/ TURBINOPLASTY Bilateral 05/09/2013   Procedure: NASAL SEPTOPLASTY AND BILATERAL INFERIOR TURBINATE REDUCTION, ;  Surgeon:  Jerrell Belfast, MD;  Location: [place];  Service: ENT;  Laterality: Bilateral;   SINUS ENDO W/FUSION Bilateral 05/09/2013   Procedure: BILATERAL ENDOSCOPIC SINUS SURGERY WITH FUSION NAVIGATION;  Surgeon: Jerrell Belfast, MD;  Location: [place];  Service: ENT;  Laterality: Bilateral;   SKIN GRAFT     right hand   svt ablation     UPPER GASTROINTESTINAL ENDOSCOPY         Home Medications    Prior to Admission medications   Medication Sig Start Date End Date Taking? Authorizing Provider  azithromycin (ZITHROMAX Z-PAK) 250 MG tablet Take two pills today followed by one a day until gone 08/14/21  Yes Raylene Everts, MD  benzonatate (TESSALON) 200 MG capsule Take 1 capsule (200 mg total) by mouth 3 (three) times daily as needed for cough. 08/14/21  Yes Raylene Everts, MD  predniSONE (DELTASONE) 20 MG tablet Take 1 tablet (20 mg total) by mouth 2 (two) times daily with a meal. 08/14/21  Yes Raylene Everts, MD  AMBULATORY NON FORMULARY MEDICATION CBD Oil BID    [provider]  cetirizine (ZYRTEC) 10 MG tablet Take 1 tablet (10 mg total) by mouth daily. 02/18/18   Emeterio Reeve, DO  fish oil-omega-3 fatty acids  1000 MG capsule Take 2 g by mouth daily.    [provider]  sildenafil (VIAGRA) 50 MG tablet Take 0.5-2 tablets (25-100 mg total) by mouth as needed for erectile dysfunction (Use lowest effective dose). 01/12/21   Emeterio Reeve, DO    Family History Family History  Problem Relation Age of Onset   Heart attack Mother        smoker   COPD Mother    Hypertension Mother    Emphysema Father        smoker   Heart disease Father    Colon cancer Neg Hx    Stomach cancer Neg Hx    Esophageal cancer Neg Hx    Rectal cancer Neg Hx     Social History Social History   Tobacco Use   Smoking status: Never   Smokeless tobacco: Never  Vaping Use   Vaping Use: Never used  Substance Use Topics   Alcohol use: No    Drug use: No     Allergies   Penicillins   Review of Systems Review of Systems See HPI  Physical Exam Triage Vital Signs ED Triage Vitals  Enc Vitals Group     BP 08/14/21 1415 123/78     Pulse Rate 08/14/21 1415 86     Resp 08/14/21 1415 16     Temp 08/14/21 1415 98.8 F (37.1 C)     Temp Source 08/14/21 1415 Oral     SpO2 08/14/21 1415 97 %     Weight 08/14/21 1417 232 lb (105.2 kg)     Height 08/14/21 1417 5\' 5"  (1.651 m)     Head Circumference --      Peak Flow --      Pain Score 08/14/21 1417 0     Pain Loc --      Pain Edu? --      Excl. in [place]? --    No data found.  Updated Vital Signs BP 123/78 (BP Location: Right Arm)   Pulse 86   Temp 98.8 F (37.1 C) (Oral)   Resp 16   Ht 5\' 5"  (1.651 m)   Wt 105.2 kg   SpO2 97%   BMI 38.61 kg/m      Physical Exam Constitutional:      General: He is not in acute distress.    Appearance: He is well-developed.  HENT:     Head: Normocephalic and atraumatic.     Right Ear: Tympanic membrane and ear canal normal.     Left Ear: Tympanic membrane and ear canal normal.     Nose: Congestion and rhinorrhea present.     Mouth/Throat:     Pharynx: No posterior oropharyngeal erythema.  Eyes:     Conjunctiva/sclera: Conjunctivae normal.     Pupils: Pupils are equal, round, and reactive to light.  Cardiovascular:     Rate and Rhythm: Normal rate.  Pulmonary:     Effort: Pulmonary effort is normal. No respiratory distress.     Breath sounds: Rhonchi present.     Comments: Significant rhonchi L vs R Abdominal:     General: There is no distension.     Palpations: Abdomen is soft.  Musculoskeletal:        General: Normal range of motion.     Cervical back: Normal range of motion.  Skin:    General: Skin is warm and dry.  Neurological:     Mental Status: He is alert.     UC Treatments /  Results  Labs (all labs ordered are listed, but only abnormal results are displayed) Labs Reviewed - No data to  display  EKG   Radiology DG Chest 2 View  Result Date: 08/14/2021 CLINICAL DATA:  Cough EXAM: CHEST - 2 VIEW COMPARISON:  2019 FINDINGS: The heart size and mediastinal contours are within normal limits. Both lungs are clear. No pleural effusion. The visualized skeletal structures are unremarkable. IMPRESSION: No active cardiopulmonary disease. Electronically Signed   By: Macy Mis M.D.   On: 08/14/2021 15:08    Procedures Procedures (including critical care time)  Medications Ordered in UC Medications - No data to display  Initial Impression / Assessment and Plan / UC Course  I have reviewed the triage vital signs and the nursing notes.  Pertinent labs & imaging results that were available during my care of the patient were reviewed by me and considered in my medical decision making (see chart for details).     Final Clinical Impressions(s) / UC Diagnoses   Final diagnoses:  Acute bronchitis, unspecified organism     Discharge Instructions      Drink lots of fluids Take the antibiotic as prescribed.  2 pills today then 1 a day for 5 days Take prednisone 2 times a day for 5 days.  This is to reduce inflammation and allergy symptoms Tessalon will help with the cough See your doctor if not improving in a week   ED Prescriptions     Medication Sig Dispense Auth. Provider   predniSONE (DELTASONE) 20 MG tablet Take 1 tablet (20 mg total) by mouth 2 (two) times daily with a meal. 10 tablet Raylene Everts, MD   azithromycin (ZITHROMAX Z-PAK) 250 MG tablet Take two pills today followed by one a day until gone 6 tablet Raylene Everts, MD   benzonatate (TESSALON) 200 MG capsule Take 1 capsule (200 mg total) by mouth 3 (three) times daily as needed for cough. 21 capsule Raylene Everts, MD      PDMP not reviewed this encounter.   Raylene Everts, MD 08/14/21 [phone]

## 2021-08-14 NOTE — Discharge Instructions (Signed)
Drink lots of fluids Take the antibiotic as prescribed.  2 pills today then 1 a day for 5 days Take prednisone 2 times a day for 5 days.  This is to reduce inflammation and allergy symptoms Tessalon will help with the cough See your doctor if not improving in a week

## 2021-12-17 IMAGING — DX DG CHEST 2V
2 series · 2 of 2 positions shown · non-contrast
Comparison: [id number]

CLINICAL DATA: Cough

EXAM:
CHEST - 2 VIEW

[chest pa]
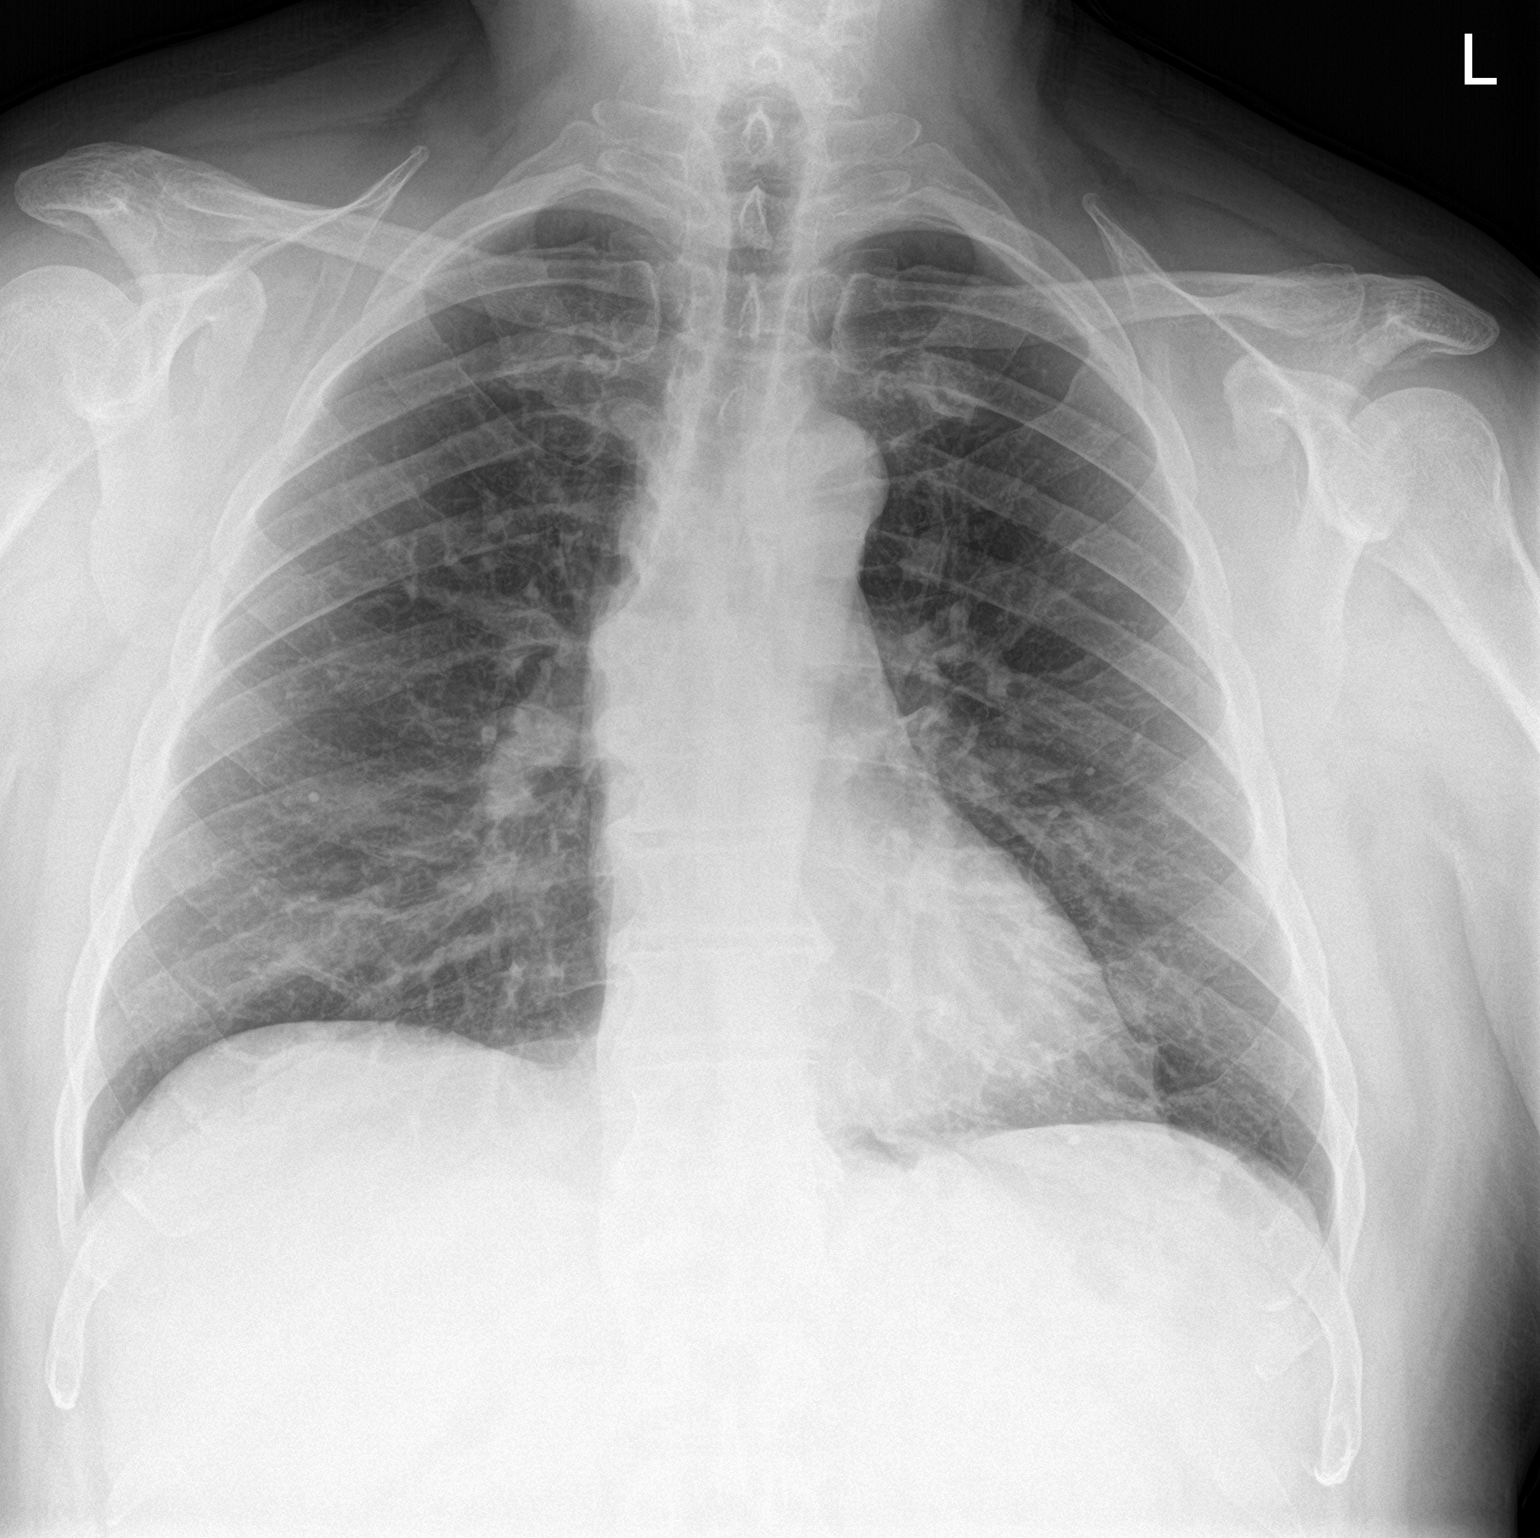

[chest lat]
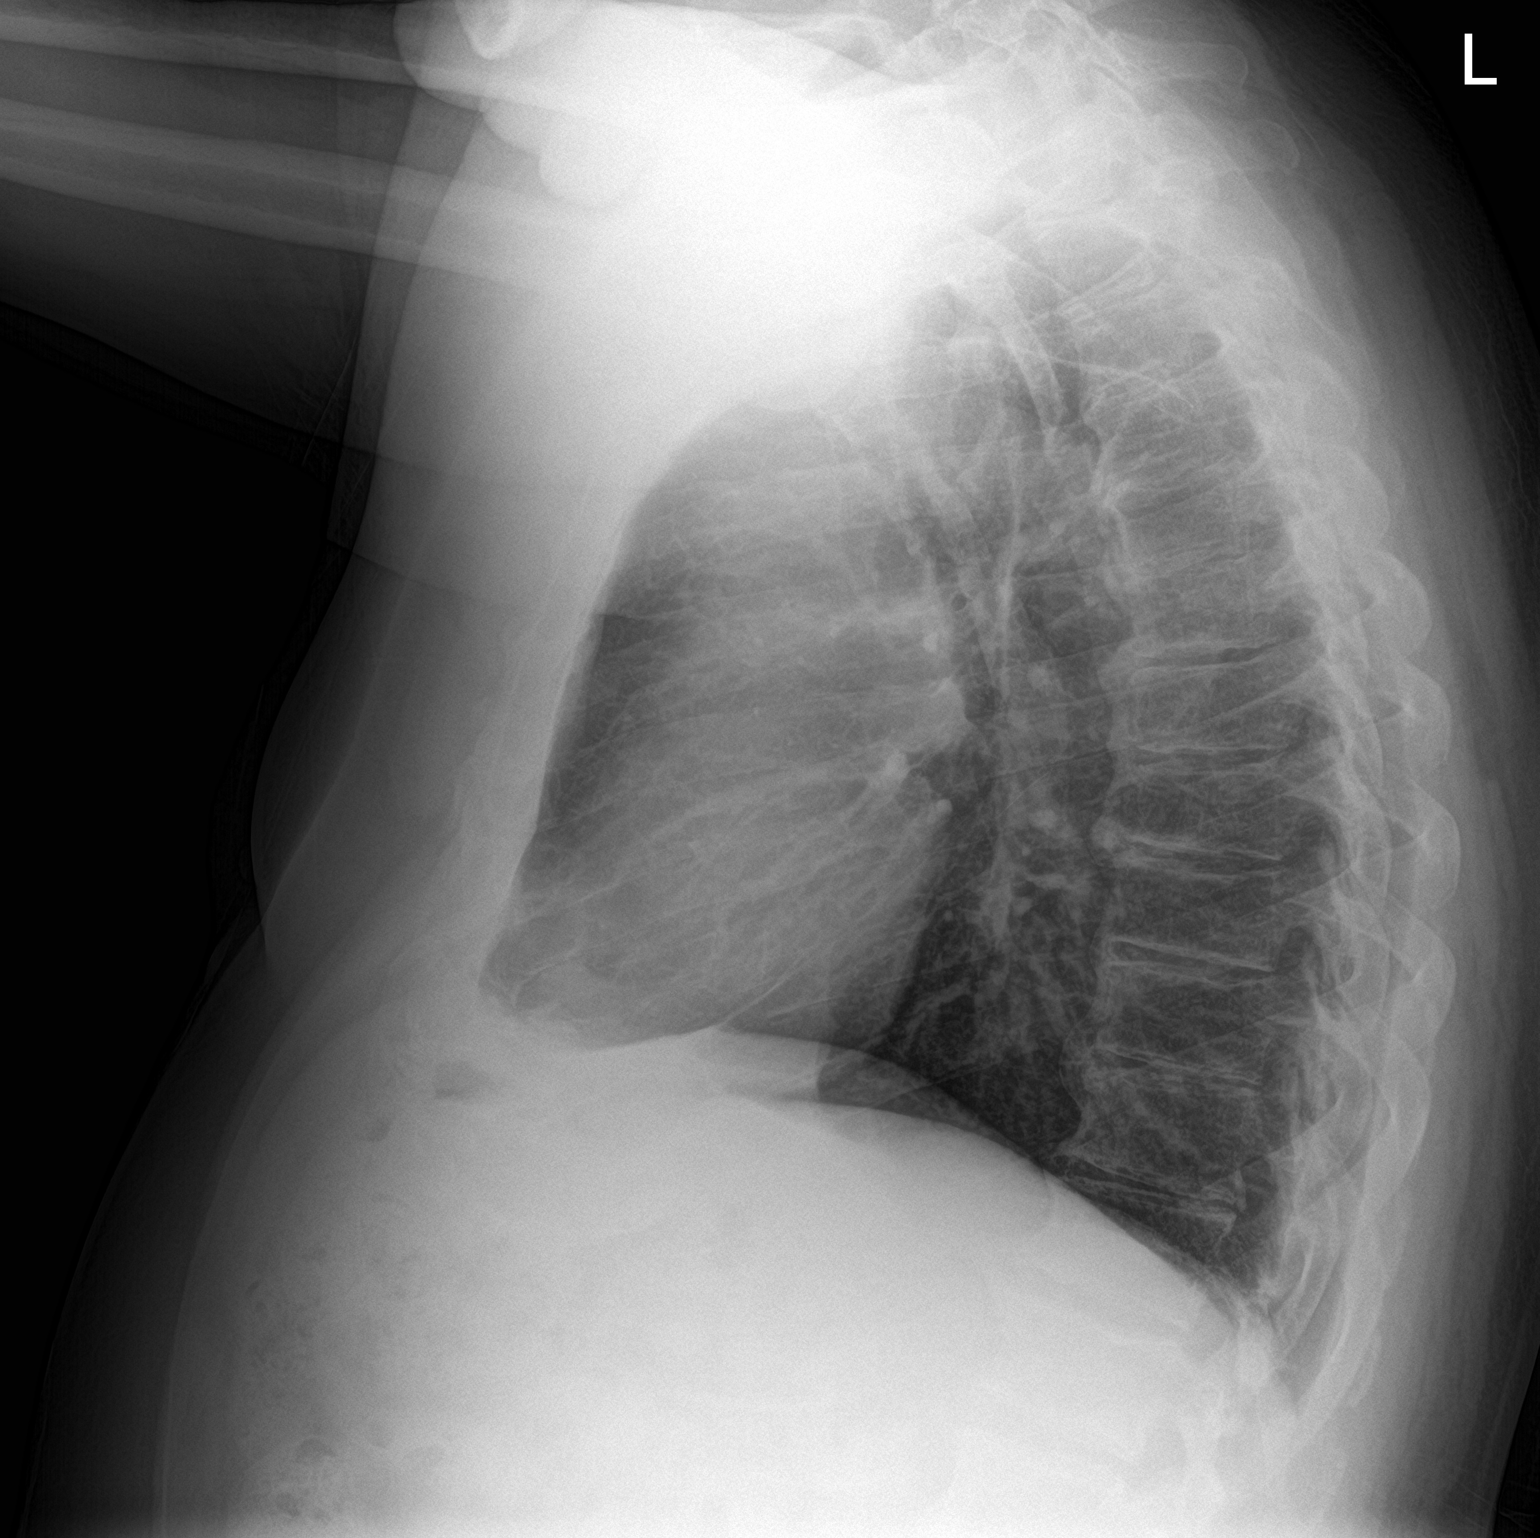

[2 of 2 positions shown; findings below may reference images not displayed]

FINDINGS: The heart size and mediastinal contours are within normal limits.
Both lungs are clear. No pleural effusion. The visualized skeletal
structures are unremarkable.
IMPRESSION: No active cardiopulmonary disease.

## 2022-01-13 ENCOUNTER — Encounter: Payer: Self-pay | Admitting: Family Medicine

## 2022-01-13 ENCOUNTER — Ambulatory Visit (INDEPENDENT_AMBULATORY_CARE_PROVIDER_SITE_OTHER): Payer: No Typology Code available for payment source | Admitting: Family Medicine

## 2022-01-13 VITALS — BP 120/81 | HR 72 | Ht 65.0 in | Wt 230.0 lb

## 2022-01-13 DIAGNOSIS — Z125 Encounter for screening for malignant neoplasm of prostate: Secondary | ICD-10-CM | POA: Diagnosis not present

## 2022-01-13 DIAGNOSIS — Z0000 Encounter for general adult medical examination without abnormal findings: Secondary | ICD-10-CM

## 2022-01-13 DIAGNOSIS — Z1211 Encounter for screening for malignant neoplasm of colon: Secondary | ICD-10-CM

## 2022-01-13 DIAGNOSIS — R195 Other fecal abnormalities: Secondary | ICD-10-CM

## 2022-01-13 MED ORDER — IPRATROPIUM BROMIDE 0.03 % NA SOLN: 2.0000 | Freq: Two times a day (BID) | NASAL | 12 refills | Status: DC

## 2022-01-13 NOTE — Assessment & Plan Note (Addendum)
Well adult ?Orders Placed This Encounter  ?Procedures  ?? COMPLETE METABOLIC PANEL WITH GFR  ?? CBC with Differential  ?? Lipid Panel w/reflex Direct LDL  ?? PSA  ?? TSH  ?? Ambulatory referral to Gastroenterology  ?  Referral Priority:   Routine  ?  Referral Type:   Consultation  ?  Referral Reason:   Specialty Services Required  ?  Number of Visits Requested:   1  ?Screening: Per lab orders.  Colon cancer screening ordered ?Immunizations:  UTD ?Anticipatory guidance/Risk factor reduction:  Recommend increased fiber intake.  Additional recommendations per AVS.   ?

## 2022-01-13 NOTE — Progress Notes (Signed)
?Thomas Gates - 61 y.o. male MRN [id number]  Date of birth: April 28, 1961 ? ?Subjective ?Chief Complaint  ?Patient presents with  ? Transitions Of Care  ? ? ?HPI ?Thomas Gates is a 61 y.o. male here today for transfer of care and annual exam.  He has history of chronic sinus congestion.  History of previous sinus surgery.  He has tried flonase and is taking cetirizine.  ? ?He does stay pretty active.  Runs a motorcycle repair shop.  He does feel like diet could be a little better.  Has noted some loose stools.  He is due for updated colonoscopy.  ? ?He is a non-smoker.  Denies EtOH use at this time.  ? ?Review of Systems  ?Constitutional:  Negative for chills, fever, malaise/fatigue and weight loss.  ?HENT:  Negative for congestion, ear pain and sore throat.   ?Eyes:  Negative for blurred vision, double vision and pain.  ?Respiratory:  Negative for cough and shortness of breath.   ?Cardiovascular:  Negative for chest pain and palpitations.  ?Gastrointestinal:  Negative for abdominal pain, blood in stool, constipation, heartburn and nausea.  ?Genitourinary:  Negative for dysuria and urgency.  ?Musculoskeletal:  Negative for joint pain and myalgias.  ?Neurological:  Negative for dizziness and headaches.  ?Endo/Heme/Allergies:  Does not bruise/bleed easily.  ?Psychiatric/Behavioral:  Negative for depression. The patient is not nervous/anxious and does not have insomnia.   ? ? ?Allergies  ?Allergen Reactions  ? Penicillins Shortness Of Breath  ?  Rash also  ? ? ?Past Medical History:  ?Diagnosis Date  ? Allergy   ? Arthritis   ? rt hand  ? CHF (congestive heart failure) ([facility])   ? hx SVT, dx CHF while in hospital for that ?due to morphine or IV fluids   ? Chronic sinusitis   ? Deviated septum   ? Dysrhythmia   ? SVT  ? ED (erectile dysfunction)   ? GERD (gastroesophageal reflux disease)   ? Hx of adenomatous polyp of colon 03/02/2017  ? Hyperlipidemia   ? Nasal polyps   ? Supraventricular tachycardia ([facility]) 1999   ? ? ?Past Surgical History:  ?Procedure Laterality Date  ? COLONOSCOPY    ? COLONOSCOPY W/ BIOPSIES    ? HAND SURGERY  1999  ? NASAL SEPTOPLASTY W/ TURBINOPLASTY Bilateral 05/09/2013  ? Procedure: NASAL SEPTOPLASTY AND BILATERAL INFERIOR TURBINATE REDUCTION, ;  Surgeon: Jerrell Belfast, MD;  Location: [facility];  Service: ENT;  Laterality: Bilateral;  ? SINUS ENDO W/FUSION Bilateral 05/09/2013  ? Procedure: BILATERAL ENDOSCOPIC SINUS SURGERY WITH FUSION NAVIGATION;  Surgeon: Jerrell Belfast, MD;  Location: [facility];  Service: ENT;  Laterality: Bilateral;  ? SKIN GRAFT    ? right hand  ? svt ablation    ? UPPER GASTROINTESTINAL ENDOSCOPY    ? ? ?Social History  ? ?Socioeconomic History  ? Marital status: Married  ?  Spouse name: Larene Beach  ? Number of children: 3  ? Years of education: Not on file  ? Highest education level: Not on file  ?Occupational History  ? Occupation: Owner  ?  Employer: [employer]  ?  Comment: Production designer, theatre/television/film  ?Tobacco Use  ? Smoking status: Never  ? Smokeless tobacco: Never  ?Vaping Use  ? Vaping Use: Never used  ?Substance and Sexual Activity  ? Alcohol use: No  ? Drug use: No  ? Sexual activity: Yes  ?  Partners: Female  ?  Birth control/protection: None  ?  Other Topics Concern  ? Not on file  ?Social History Narrative  ? Married second time  ? 1 daughter born 39 from first marriage  ? 1 son born 2002 from second marriage, daughter born 2006 from second marriage  ? He is a Public house manager  ? Wife is an Therapist, sports on the life care ambulance with [employer]  ? 4 caffeinated beverages daily  ? 12/12/2016  ? ?Social Determinants of Health  ? ?Financial Resource Strain: Not on file  ?Food Insecurity: Not on file  ?Transportation Needs: Not on file  ?Physical Activity: Not on file  ?Stress: Not on file  ?Social Connections: Not on file  ? ? ?Family History  ?Problem Relation Age of Onset  ? Heart attack Mother   ?     smoker  ? COPD Mother   ? Hypertension  Mother   ? Emphysema Father   ?     smoker  ? Heart disease Father   ? Colon cancer Neg Hx   ? Stomach cancer Neg Hx   ? Esophageal cancer Neg Hx   ? Rectal cancer Neg Hx   ? ? ?Health Maintenance  ?Topic Date Due  ? COVID-19 Vaccine (4 - Booster for [place] series) 05/05/2022 (Originally 10/27/2020)  ? COLONOSCOPY (Pts 45-20yr Insurance coverage will need to be confirmed)  02/26/2022  ? INFLUENZA VACCINE  04/04/2022  ? TETANUS/TDAP  05/04/2026  ? Hepatitis C Screening  Completed  ? Zoster Vaccines- Shingrix  Completed  ? HPV VACCINES  Aged Out  ? HIV Screening  Discontinued  ? ? ? ?----------------------------------------------------------------------------------------------------------------------------------------------------------------------------------------------------------------- ?Physical Exam ?BP 120/81 (BP Location: Left Arm, Patient Position: Sitting, Cuff Size: Large)   Pulse 72   Ht '5\' 5"'$  (1.651 m)   Wt 230 lb (104.3 kg)   SpO2 96%   BMI 38.27 kg/m?  ? ?Physical Exam ?Constitutional:   ?   General: He is not in acute distress. ?HENT:  ?   Head: Normocephalic and atraumatic.  ?   Right Ear: Tympanic membrane and external ear normal.  ?   Left Ear: Tympanic membrane and external ear normal.  ?Eyes:  ?   General: No scleral icterus. ?Neck:  ?   Thyroid: No thyromegaly.  ?Cardiovascular:  ?   Rate and Rhythm: Normal rate and regular rhythm.  ?   Heart sounds: Normal heart sounds.  ?Pulmonary:  ?   Effort: Pulmonary effort is normal.  ?   Breath sounds: Normal breath sounds.  ?Abdominal:  ?   General: Bowel sounds are normal. There is no distension.  ?   Palpations: Abdomen is soft.  ?   Tenderness: There is no abdominal tenderness. There is no guarding.  ?Musculoskeletal:  ?   Cervical back: Normal range of motion.  ?Lymphadenopathy:  ?   Cervical: No cervical adenopathy.  ?Skin: ?   General: Skin is warm and dry.  ?   Findings: No rash.  ?Neurological:  ?   Mental Status: He is alert and oriented to  person, place, and time.  ?   Cranial Nerves: No cranial nerve deficit.  ?   Motor: No abnormal muscle tone.  ?Psychiatric:     ?   Mood and Affect: Mood normal.     ?   Behavior: Behavior normal.  ? ? ?------------------------------------------------------------------------------------------------------------------------------------------------------------------------------------------------------------------- ?Assessment and Plan ? ?Well adult exam ?Well adult ?Orders Placed This Encounter  ?Procedures  ? COMPLETE METABOLIC PANEL WITH GFR  ? CBC with Differential  ? Lipid Panel  w/reflex Direct LDL  ? PSA  ? TSH  ? Ambulatory referral to Gastroenterology  ?  Referral Priority:   Routine  ?  Referral Type:   Consultation  ?  Referral Reason:   Specialty Services Required  ?  Number of Visits Requested:   1  ?Screening: Per lab orders.  Colon cancer screening ordered ?Immunizations:  UTD ?Anticipatory guidance/Risk factor reduction:  Recommend increased fiber intake.  Additional recommendations per AVS.   ? ? ?No orders of the defined types were placed in this encounter. ? ? ?No follow-ups on file. ? ? ? ?This visit occurred during the SARS-CoV-2 public health emergency.  Safety protocols were in place, including screening questions prior to the visit, additional usage of staff PPE, and extensive cleaning of exam room while observing appropriate contact time as indicated for disinfecting solutions.  ? ?

## 2022-01-13 NOTE — Patient Instructions (Addendum)
-I have placed orders for colon cancer screening.  ?-Try increasing fiber intake. I would recommend Align probiotic as well. ?-Try atrovent nasal spray.   ? ? ?Preventive Care 61 Years Old, Male ?Preventive care refers to lifestyle choices and visits with your health care provider that can promote health and wellness. Preventive care visits are also called wellness exams. ?What can I expect for my preventive care visit? ?Counseling ?During your preventive care visit, your health care provider may ask about your: ?Medical history, including: ?Past medical problems. ?Family medical history. ?Current health, including: ?Emotional well-being. ?Home life and relationship well-being. ?Sexual activity. ?Lifestyle, including: ?Alcohol, nicotine or tobacco, and drug use. ?Access to firearms. ?Diet, exercise, and sleep habits. ?Safety issues such as seatbelt and bike helmet use. ?Sunscreen use. ?Work and work Statistician. ?Physical exam ?Your health care provider will check your: ?Height and weight. These may be used to calculate your BMI (body mass index). BMI is a measurement that tells if you are at a healthy weight. ?Waist circumference. This measures the distance around your waistline. This measurement also tells if you are at a healthy weight and may help predict your risk of certain diseases, such as type 2 diabetes and high blood pressure. ?Heart rate and blood pressure. ?Body temperature. ?Skin for abnormal spots. ?What immunizations do I need? ? ?Vaccines are usually given at various ages, according to a schedule. Your health care provider will recommend vaccines for you based on your age, medical history, and lifestyle or other factors, such as travel or where you work. ?What tests do I need? ?Screening ?Your health care provider may recommend screening tests for certain conditions. This may include: ?Lipid and cholesterol levels. ?Diabetes screening. This is done by checking your blood sugar (glucose) after you  have not eaten for a while (fasting). ?Hepatitis B test. ?Hepatitis C test. ?HIV (human immunodeficiency virus) test. ?STI (sexually transmitted infection) testing, if you are at risk. ?Lung cancer screening. ?Prostate cancer screening. ?Colorectal cancer screening. ?Talk with your health care provider about your test results, treatment options, and if necessary, the need for more tests. ?Follow these instructions at home: ?Eating and drinking ? ?Eat a diet that includes fresh fruits and vegetables, whole grains, lean protein, and low-fat dairy products. ?Take vitamin and mineral supplements as recommended by your health care provider. ?Do not drink alcohol if your health care provider tells you not to drink. ?If you drink alcohol: ?Limit how much you have to 0-2 drinks a day. ?Know how much alcohol is in your drink. In the [place] one drink equals one 12 oz bottle of beer (355 mL), one 5 oz glass of wine (148 mL), or one 1? oz glass of hard liquor (44 mL). ?Lifestyle ?Brush your teeth every morning and night with fluoride toothpaste. Floss one time each day. ?Exercise for at least 30 minutes 5 or more days each week. ?Do not use any products that contain nicotine or tobacco. These products include cigarettes, chewing tobacco, and vaping devices, such as e-cigarettes. If you need help quitting, ask your health care provider. ?Do not use drugs. ?If you are sexually active, practice safe sex. Use a condom or other form of protection to prevent STIs. ?Take aspirin only as told by your health care provider. Make sure that you understand how much to take and what form to take. Work with your health care provider to find out whether it is safe and beneficial for you to take aspirin daily. ?Find healthy ways to manage  stress, such as: ?Meditation, yoga, or listening to music. ?Journaling. ?Talking to a trusted person. ?Spending time with friends and family. ?Minimize exposure to UV radiation to reduce your risk of skin  cancer. ?Safety ?Always wear your seat belt while driving or riding in a vehicle. ?Do not drive: ?If you have been drinking alcohol. Do not ride with someone who has been drinking. ?When you are tired or distracted. ?While texting. ?If you have been using any mind-altering substances or drugs. ?Wear a helmet and other protective equipment during sports activities. ?If you have firearms in your house, make sure you follow all gun safety procedures. ?What's next? ?Go to your health care provider once a year for an annual wellness visit. ?Ask your health care provider how often you should have your eyes and teeth checked. ?Stay up to date on all vaccines. ?This information is not intended to replace advice given to you by your health care provider. Make sure you discuss any questions you have with your health care provider. ?Document Revised: 02/16/2021 Document Reviewed: 02/16/2021 ?Elsevier Patient Education ? [place]. ? ?

## 2022-01-14 LAB — LIPID PANEL W/REFLEX DIRECT LDL
Cholesterol: 202 mg/dL — ABNORMAL HIGH (ref <200)
HDL: 60 mg/dL (ref >=40)
LDL Cholesterol (Calc): 121 mg/dL (calc) — ABNORMAL HIGH
Non-HDL Cholesterol (Calc): 142 mg/dL (calc) — ABNORMAL HIGH (ref <130)
Total CHOL/HDL Ratio: 3.4 (calc) (ref <5.0)
Triglycerides: 104 mg/dL (ref <150)

## 2022-01-14 LAB — COMPLETE METABOLIC PANEL WITH GFR
AG Ratio: 1.8 (calc) (ref 1.0–2.5)
ALT: 16 U/L (ref 9–46)
AST: 14 U/L (ref 10–35)
Albumin: 4.3 g/dL (ref 3.6–5.1)
Alkaline phosphatase (APISO): 71 U/L (ref 35–144)
BUN: 18 mg/dL (ref 7–25)
CO2: 30 mmol/L (ref 20–32)
Calcium: 9.1 mg/dL (ref 8.6–10.3)
Chloride: 105 mmol/L (ref 98–110)
Creat: 1.07 mg/dL (ref 0.70–1.30)
Globulin: 2.4 g/dL (calc) (ref 1.9–3.7)
Glucose, Bld: 104 mg/dL — ABNORMAL HIGH (ref 65–99)
Potassium: 4.3 mmol/L (ref 3.5–5.3)
Sodium: 142 mmol/L (ref 135–146)
Total Bilirubin: 0.4 mg/dL (ref 0.2–1.2)
Total Protein: 6.7 g/dL (ref 6.1–8.1)
eGFR: 80 mL/min/{1.73_m2} (ref >=60)

## 2022-01-14 LAB — CBC WITH DIFFERENTIAL/PLATELET
Absolute Monocytes: 533 cells/uL (ref 200–950)
Basophils Absolute: 59 cells/uL (ref 0–200)
Basophils Relative: 0.9 %
Eosinophils Absolute: 514 cells/uL — ABNORMAL HIGH (ref 15–500)
Eosinophils Relative: 7.9 %
HCT: 43.2 % (ref 38.5–50.0)
Hemoglobin: 14.4 g/dL (ref 13.2–17.1)
Lymphs Abs: 2340 cells/uL (ref 850–3900)
MCH: 29.8 pg (ref 27.0–33.0)
MCHC: 33.3 g/dL (ref 32.0–36.0)
MCV: 89.4 fL (ref 80.0–100.0)
MPV: 9.2 fL (ref 7.5–12.5)
Monocytes Relative: 8.2 %
Neutro Abs: 3055 cells/uL (ref 1500–7800)
Neutrophils Relative %: 47 %
Platelets: 261 10*3/uL (ref 140–400)
RBC: 4.83 10*6/uL (ref 4.20–5.80)
RDW: 12 % (ref 11.0–15.0)
Total Lymphocyte: 36 %
WBC: 6.5 10*3/uL (ref 3.8–10.8)

## 2022-01-14 LAB — PSA: PSA: 0.41 ng/mL (ref <=4.00)

## 2022-01-14 LAB — TSH: TSH: 2.65 mIU/L (ref 0.40–4.50)

## 2022-02-02 ENCOUNTER — Encounter: Payer: Self-pay | Admitting: Family Medicine

## 2022-02-02 DIAGNOSIS — Z1211 Encounter for screening for malignant neoplasm of colon: Secondary | ICD-10-CM

## 2022-02-08 ENCOUNTER — Other Ambulatory Visit (HOSPITAL_COMMUNITY): Payer: Self-pay

## 2022-03-22 ENCOUNTER — Encounter: Payer: Self-pay | Admitting: Internal Medicine

## 2022-05-04 ENCOUNTER — Encounter: Payer: Self-pay | Admitting: Internal Medicine

## 2022-05-23 ENCOUNTER — Ambulatory Visit (AMBULATORY_SURGERY_CENTER): Payer: No Typology Code available for payment source | Admitting: *Deleted

## 2022-05-23 VITALS — Ht 65.0 in | Wt 228.8 lb

## 2022-05-23 DIAGNOSIS — Z86010 Personal history of colonic polyps: Secondary | ICD-10-CM

## 2022-05-23 NOTE — Progress Notes (Signed)
No egg or soy allergy known to patient  No issues known to pt with past sedation with any surgeries or procedures Patient denies ever being told they had issues or difficulty with intubation  No FH of Malignant Hyperthermia Pt is not on diet pills Pt is not on home 02  Pt is not on blood thinners  Pt denies issues with constipation  No A fib or A flutter Have any cardiac testing pending--NO Pt instructed to use Singlecare.com or GoodRx for a price reduction on prep   

## 2022-06-20 ENCOUNTER — Ambulatory Visit (AMBULATORY_SURGERY_CENTER): Payer: No Typology Code available for payment source | Admitting: Internal Medicine

## 2022-06-20 ENCOUNTER — Encounter: Payer: Self-pay | Admitting: Internal Medicine

## 2022-06-20 VITALS — BP 128/89 | HR 73 | Temp 96.0°F | Resp 18 | Ht 65.0 in | Wt 228.0 lb

## 2022-06-20 DIAGNOSIS — D122 Benign neoplasm of ascending colon: Secondary | ICD-10-CM | POA: Diagnosis not present

## 2022-06-20 DIAGNOSIS — Z86010 Personal history of colonic polyps: Secondary | ICD-10-CM | POA: Diagnosis not present

## 2022-06-20 DIAGNOSIS — Z09 Encounter for follow-up examination after completed treatment for conditions other than malignant neoplasm: Secondary | ICD-10-CM | POA: Diagnosis present

## 2022-06-20 DIAGNOSIS — D123 Benign neoplasm of transverse colon: Secondary | ICD-10-CM

## 2022-06-20 DIAGNOSIS — K5730 Diverticulosis of large intestine without perforation or abscess without bleeding: Secondary | ICD-10-CM

## 2022-06-20 DIAGNOSIS — Z1211 Encounter for screening for malignant neoplasm of colon: Secondary | ICD-10-CM

## 2022-06-20 MED ORDER — SODIUM CHLORIDE 0.9 % IV SOLN: 500.0000 mL | Freq: Once | INTRAVENOUS | Status: DC

## 2022-06-20 MED ORDER — SODIUM CHLORIDE 0.9 % IV SOLN: 500.0000 mL | Freq: Once | INTRAVENOUS | Status: AC

## 2022-06-20 NOTE — Progress Notes (Unsigned)
Vitals-DT  Pt's states no medical or surgical changes since previsit or office visit.  

## 2022-06-20 NOTE — Progress Notes (Signed)
Called to room to assist during endoscopic procedure.  Patient ID and intended procedure confirmed with present staff. Received instructions for my participation in the procedure from the performing physician.  

## 2022-06-20 NOTE — Op Note (Signed)
[facility] Patient Name: Thomas Gates Procedure Date: 06/20/2022 4:05 PM MRN: [id number] Endoscopist: Gatha Mayer , MD Age: 61 Referring MD:  Date of Birth: July 16, 1961 Gender: Male Account #: [id number] Procedure:                Colonoscopy Indications:              Surveillance: Personal history of adenomatous                            polyps on last colonoscopy 5 years ago, Last                            colonoscopy: 2018 Medicines:                Monitored Anesthesia Care Procedure:                Pre-Anesthesia Assessment:                           - Prior to the procedure, a History and Physical                            was performed, and patient medications and                            allergies were reviewed. The patient's tolerance of                            previous anesthesia was also reviewed. The risks                            and benefits of the procedure and the sedation                            options and risks were discussed with the patient.                            All questions were answered, and informed consent                            was obtained. Prior Anticoagulants: The patient has                            taken no previous anticoagulant or antiplatelet                            agents. ASA Grade Assessment: III - A patient with                            severe systemic disease. After reviewing the risks                            and benefits, the patient was deemed in  satisfactory condition to undergo the procedure.                           After obtaining informed consent, the colonoscope                            was passed under direct vision. Throughout the                            procedure, the patient's blood pressure, pulse, and                            oxygen saturations were monitored continuously. The                            Olympus CF-HQ190L [id number]) Colonoscope was                             introduced through the anus and advanced to the the                            cecum, identified by appendiceal orifice and                            ileocecal valve. The colonoscopy was somewhat                            difficult due to abdominal breating and motion. The                            patient tolerated the procedure well. The quality                            of the bowel preparation was good. The bowel                            preparation used was Miralax via split dose                            instruction. The ileocecal valve, appendiceal                            orifice, and rectum were photographed. Scope In: 4:22:13 PM Scope Out: 4:48:13 PM Scope Withdrawal Time: 0 hours 21 minutes 24 seconds  Total Procedure Duration: 0 hours 26 minutes 0 seconds  Findings:                 The perianal and digital rectal examinations were                            normal.                           Three sessile polyps were found in the transverse  colon and ascending colon. The polyps were 2 to 3                            mm in size. These polyps were removed with a cold                            snare. Resection and retrieval were complete.                            Verification of patient identification for the                            specimen was done. Estimated blood loss was minimal.                           Multiple diverticula were found in the sigmoid                            colon.                           The exam was otherwise without abnormality on                            direct and retroflexion views. Complications:            No immediate complications. Estimated Blood Loss:     Estimated blood loss was minimal. Impression:               - Three 2 to 3 mm polyps in the transverse colon                            and in the ascending colon, removed with a cold                            snare.  Resected and retrieved.                           - Diverticulosis in the sigmoid colon.                           - The examination was otherwise normal on direct                            and retroflexion views.                           - Personal history of colonic polyp 3 mm adenoma                            2018. Recommendation:           - Patient has a contact number available for                            emergencies.  The signs and symptoms of potential                            delayed complications were discussed with the                            patient. Return to normal activities tomorrow.                            Written discharge instructions were provided to the                            patient.                           - Resume previous diet.                           - Continue present medications.                           - Await pathology results.                           - Repeat colonoscopy is recommended for                            surveillance. The colonoscopy date will be                            determined after pathology results from today's                            exam become available for review. Gatha Mayer, MD 06/20/2022 4:58:24 PM This report has been signed electronically.

## 2022-06-20 NOTE — Progress Notes (Signed)
Pt with OSA style respiratory pattern. Airway supported with gentle chin lift. Pt suctioned for a small amount of clear, thin secretions. VSS. +ETCO2 noted. Good waveform.

## 2022-06-20 NOTE — Progress Notes (Signed)
Pt awake, alert and oriented. VSS. Airway intact. SBAR complete to RN. All questions answered.  

## 2022-06-20 NOTE — Patient Instructions (Addendum)
I removed 3 tiny polyps and saw diverticulosis.  I will let you know pathology results and when to have another routine colonoscopy by mail and/or My Chart.  I appreciate the opportunity to care for you. Gatha Mayer, MD, [facility]    Handouts on polyps & diverticulosis given to you   Await pathology results on polyps removed    YOU HAD AN ENDOSCOPIC PROCEDURE TODAY AT [facility]:   Refer to the procedure report that was given to you for any specific questions about what was found during the examination.  If the procedure report does not answer your questions, please call your gastroenterologist to clarify.  If you requested that your care partner not be given the details of your procedure findings, then the procedure report has been included in a sealed envelope for you to review at your convenience later.  YOU SHOULD EXPECT: Some feelings of bloating in the abdomen. Passage of more gas than usual.  Walking can help get rid of the air that was put into your GI tract during the procedure and reduce the bloating. If you had a lower endoscopy (such as a colonoscopy or flexible sigmoidoscopy) you may notice spotting of blood in your stool or on the toilet paper. If you underwent a bowel prep for your procedure, you may not have a normal bowel movement for a few days.  Please Note:  You might notice some irritation and congestion in your nose or some drainage.  This is from the oxygen used during your procedure.  There is no need for concern and it should clear up in a day or so.  SYMPTOMS TO REPORT IMMEDIATELY:  Following lower endoscopy (colonoscopy or flexible sigmoidoscopy):  Excessive amounts of blood in the stool  Significant tenderness or worsening of abdominal pains  Swelling of the abdomen that is new, acute  Fever of 100F or higher  For urgent or emergent issues, a gastroenterologist can be reached at any hour by calling [phone]. Do not use MyChart messaging  for urgent concerns.    DIET:  We do recommend a small meal at first, but then you may proceed to your regular diet.  Drink plenty of fluids but you should avoid alcoholic beverages for 24 hours.  ACTIVITY:  You should plan to take it easy for the rest of today and you should NOT DRIVE or use heavy machinery until tomorrow (because of the sedation medicines used during the test).    FOLLOW UP: Our staff will call the number listed on your records the next business day following your procedure.  We will call around 7:15- 8:00 am to check on you and address any questions or concerns that you may have regarding the information given to you following your procedure. If we do not reach you, we will leave a message.     If any biopsies were taken you will be contacted by phone or by letter within the next 1-3 weeks.  Please call us at [phone] if you have not heard about the biopsies in 3 weeks.    SIGNATURES/CONFIDENTIALITY: You and/or your care partner have signed paperwork which will be entered into your electronic medical record.  These signatures attest to the fact that that the information above on your After Visit Summary has been reviewed and is understood.  Full responsibility of the confidentiality of this discharge information lies with you and/or your care-partner.

## 2022-06-20 NOTE — Progress Notes (Unsigned)
[facility] History and Physical   Primary Care Physician:  Luetta Nutting, DO   Reason for Procedure:   Hx colon polyp  Plan:    colonoscopy     HPI: Thomas Gates is a 61 y.o. male s/p removal 3 mm cecal adenoma 2018.  Past Medical History:  Diagnosis Date   Allergy    Arthritis    rt hand   CHF (congestive heart failure) (HCC)    hx SVT, dx CHF while in hospital for that ?due to morphine or IV fluids    Chronic sinusitis    Deviated septum    Dysrhythmia    SVT   ED (erectile dysfunction)    GERD (gastroesophageal reflux disease)    Hx of adenomatous polyp of colon 03/02/2017   Hyperlipidemia    Nasal polyps    Supraventricular tachycardia 1999    Past Surgical History:  Procedure Laterality Date   COLONOSCOPY     COLONOSCOPY W/ BIOPSIES     HAND SURGERY  1999   NASAL SEPTOPLASTY W/ TURBINOPLASTY Bilateral 05/09/2013   Procedure: NASAL SEPTOPLASTY AND BILATERAL INFERIOR TURBINATE REDUCTION, ;  Surgeon: Jerrell Belfast, MD;  Location: [facility];  Service: ENT;  Laterality: Bilateral;   POLYPECTOMY     SINUS ENDO W/FUSION Bilateral 05/09/2013   Procedure: BILATERAL ENDOSCOPIC SINUS SURGERY WITH FUSION NAVIGATION;  Surgeon: Jerrell Belfast, MD;  Location: [facility];  Service: ENT;  Laterality: Bilateral;   SKIN GRAFT     right hand   svt ablation     UPPER GASTROINTESTINAL ENDOSCOPY      Prior to Admission medications   Medication Sig Start Date End Date Taking? Authorizing Provider  Ascorbic Acid (VITAMIN C) 1000 MG tablet Take 1,000 mg by mouth daily.   Yes [provider]  cetirizine (ZYRTEC) 10 MG tablet Take 1 tablet (10 mg total) by mouth daily. 02/18/18  Yes Emeterio Reeve, DO  diphenhydrAMINE HCl (BENADRYL ALLERGY PO) Take by mouth as needed.   Yes [provider]  fish oil-omega-3 fatty acids 1000 MG capsule Take 2 g by mouth daily.   Yes [provider]  MAGNESIUM PO Take by  mouth at bedtime.   Yes [provider]  OVER THE COUNTER MEDICATION RED YEAST RICE TWICE A DAY BEFORE MEALS   Yes [provider]  AMBULATORY NON FORMULARY MEDICATION CBD Oil BID Patient not taking: Reported on 05/23/2022    [provider]  azithromycin (ZITHROMAX Z-PAK) 250 MG tablet Take two pills today followed by one a day until gone 08/14/21   Raylene Everts, MD  benzonatate (TESSALON) 200 MG capsule Take 1 capsule (200 mg total) by mouth 3 (three) times daily as needed for cough. 08/14/21   Raylene Everts, MD  ipratropium (ATROVENT) 0.03 % nasal spray Place 2 sprays into both nostrils every 12 (twelve) hours. 01/13/22   Luetta Nutting, DO  predniSONE (DELTASONE) 20 MG tablet Take 1 tablet (20 mg total) by mouth 2 (two) times daily with a meal. 08/14/21   Raylene Everts, MD  sildenafil (VIAGRA) 50 MG tablet Take 0.5-2 tablets (25-100 mg total) by mouth as needed for erectile dysfunction (Use lowest effective dose). 01/12/21   Emeterio Reeve, DO    Current Outpatient Medications  Medication Sig Dispense Refill   Ascorbic Acid (VITAMIN C) 1000 MG tablet Take 1,000 mg by mouth daily.     cetirizine (ZYRTEC) 10 MG tablet Take 1 tablet (10 mg total) by  mouth daily. 90 tablet 3   diphenhydrAMINE HCl (BENADRYL ALLERGY PO) Take by mouth as needed.     fish oil-omega-3 fatty acids 1000 MG capsule Take 2 g by mouth daily.     MAGNESIUM PO Take by mouth at bedtime.     OVER THE COUNTER MEDICATION RED YEAST RICE TWICE A DAY BEFORE MEALS     AMBULATORY NON FORMULARY MEDICATION CBD Oil BID (Patient not taking: Reported on 05/23/2022)     ipratropium (ATROVENT) 0.03 % nasal spray Place 2 sprays into both nostrils every 12 (twelve) hours. 30 mL 12   sildenafil (VIAGRA) 50 MG tablet Take 0.5-2 tablets (25-100 mg total) by mouth as needed for erectile dysfunction (Use lowest effective dose). 30 tablet 5   Current Facility-Administered Medications  Medication Dose  Route Frequency Provider Last Rate Last Admin   0.9 %  sodium chloride infusion  500 mL Intravenous Once Gatha Mayer, MD       0.9 %  sodium chloride infusion  500 mL Intravenous Once Gatha Mayer, MD        Allergies as of 06/20/2022 - Review Complete 06/20/2022  Allergen Reaction Noted   Penicillins Shortness Of Breath 01/08/2013    Family History  Problem Relation Age of Onset   Ulcerative colitis Mother    Heart attack Mother        smoker   COPD Mother    Hypertension Mother    Emphysema Father        smoker   Heart disease Father    Colon cancer Neg Hx    Stomach cancer Neg Hx    Esophageal cancer Neg Hx    Rectal cancer Neg Hx    Colon polyps Neg Hx    Crohn's disease Neg Hx     Social History   Socioeconomic History   Marital status: Married    Spouse name: Conservation officer, historic buildings   Number of children: 3   Years of education: Not on file   Highest education level: Not on file  Occupational History   Occupation: Information systems manager: Geneticist, molecular    Comment: Production designer, theatre/television/film  Tobacco Use   Smoking status: Never    Passive exposure: Past (PARENTS SMOKED)   Smokeless tobacco: Never  Vaping Use   Vaping Use: Never used  Substance and Sexual Activity   Alcohol use: No   Drug use: No   Sexual activity: Yes    Partners: Female    Birth control/protection: None  Other Topics Concern   Not on file  Social History Narrative   Married second time   1 daughter born 19 from first marriage   1 son born 2002 from second marriage, daughter born 2006 from second marriage   He is a Public house manager   Wife is an Therapist, sports on the life care ambulance with [place]   4 caffeinated beverages daily   12/12/2016   Social Determinants of Health   Financial Resource Strain: Not on file  Food Insecurity: Not on file  Transportation Needs: Not on file  Physical Activity: Not on file  Stress: Not on file  Social Connections: Not on file  Intimate Partner Violence:  Not on file    Review of Systems:  All other review of systems negative except as mentioned in the HPI.  Physical Exam: Vital signs BP 106/73 (BP Location: Right Arm, Patient Position: Sitting, Cuff Size: Normal)   Pulse 75   Temp (!) 96 F (  35.6 C) (Temporal)   Ht '5\' 5"'$  (1.651 m)   Wt 228 lb (103.4 kg)   SpO2 96%   BMI 37.94 kg/m   General:   Alert,  Well-developed, well-nourished, pleasant and cooperative in NAD Lungs:  Clear throughout to auscultation.   Heart:  Regular rate and rhythm; no murmurs, clicks, rubs,  or gallops. Abdomen:  Soft, nontender and nondistended. Normal bowel sounds.   Neuro/Psych:  Alert and cooperative. Normal mood and affect. A and O x 3   '@Pius Byrom'$  Simonne Maffucci, MD, [facility] [phone] (pager) 06/20/2022 4:13 PM@

## 2022-06-21 ENCOUNTER — Telehealth: Payer: Self-pay | Admitting: *Deleted

## 2022-06-21 NOTE — Telephone Encounter (Signed)
Left message on f/u call 

## 2022-06-27 ENCOUNTER — Encounter: Payer: Self-pay | Admitting: Internal Medicine

## 2023-01-15 ENCOUNTER — Ambulatory Visit (INDEPENDENT_AMBULATORY_CARE_PROVIDER_SITE_OTHER): Payer: 59 | Admitting: Family Medicine

## 2023-01-15 ENCOUNTER — Encounter: Payer: Self-pay | Admitting: Family Medicine

## 2023-01-15 VITALS — BP 133/78 | HR 70 | Ht 65.0 in | Wt 236.0 lb

## 2023-01-15 DIAGNOSIS — Z125 Encounter for screening for malignant neoplasm of prostate: Secondary | ICD-10-CM

## 2023-01-15 DIAGNOSIS — Z0000 Encounter for general adult medical examination without abnormal findings: Secondary | ICD-10-CM | POA: Diagnosis not present

## 2023-01-15 NOTE — Patient Instructions (Signed)

## 2023-01-15 NOTE — Assessment & Plan Note (Signed)
Well adult Orders Placed This Encounter  Procedures   COMPLETE METABOLIC PANEL WITH GFR   CBC with Differential   Lipid Panel w/reflex Direct LDL   TSH   PSA  Screenings: per lab orders Immunizations:  UTD Anticipatory guidance/Risk factor reduction:  Recommendations per AVS.

## 2023-01-15 NOTE — Progress Notes (Signed)
Thomas Gates - 62 y.o. male MRN [id number]  Date of birth: 1961/01/25  Subjective Chief Complaint  Patient presents with   Annual Exam    HPI Thomas Gates is a 62 y.o. male here today for annual exam.    He reports that he is doing pretty well.  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   He continues to stay pretty active.  He feels that diet is pretty good.   He is a non-smoker.  Rare EtOH intake.   He is UTD on colon cancer screening.    Review of Systems  Constitutional:  Negative for chills, fever, malaise/fatigue and weight loss.  HENT:  Negative for congestion, ear pain and sore throat.   Eyes:  Negative for blurred vision, double vision and pain.  Respiratory:  Negative for cough and shortness of breath.   Cardiovascular:  Negative for chest pain and palpitations.  Gastrointestinal:  Negative for abdominal pain, blood in stool, constipation, heartburn and nausea.  Genitourinary:  Negative for dysuria and urgency.  Musculoskeletal:  Negative for joint pain and myalgias.  Neurological:  Negative for dizziness and headaches.  Endo/Heme/Allergies:  Does not bruise/bleed easily.  Psychiatric/Behavioral:  Negative for depression. The patient is not nervous/anxious and does not have insomnia.     Allergies  Allergen Reactions   Penicillins Shortness Of Breath    Rash also    Past Medical History:  Diagnosis Date   Allergy    Arthritis    rt hand   CHF (congestive heart failure) (HCC)    hx SVT, dx CHF while in hospital for that ?due to morphine or IV fluids    Chronic sinusitis    Deviated septum    Dysrhythmia    SVT   ED (erectile dysfunction)    GERD (gastroesophageal reflux disease)    Hx of adenomatous polyp of colon 03/02/2017   Hyperlipidemia    Nasal polyps    Supraventricular tachycardia 1999    Past Surgical History:  Procedure Laterality Date   COLONOSCOPY     COLONOSCOPY W/ BIOPSIES     HAND SURGERY  1999   NASAL  SEPTOPLASTY W/ TURBINOPLASTY Bilateral 05/09/2013   Procedure: NASAL SEPTOPLASTY AND BILATERAL INFERIOR TURBINATE REDUCTION, ;  Surgeon: Osborn Coho, MD;  Location: [facility];  Service: ENT;  Laterality: Bilateral;   POLYPECTOMY     SINUS ENDO W/FUSION Bilateral 05/09/2013   Procedure: BILATERAL ENDOSCOPIC SINUS SURGERY WITH FUSION NAVIGATION;  Surgeon: Osborn Coho, MD;  Location: [facility];  Service: ENT;  Laterality: Bilateral;   SKIN GRAFT     right hand   svt ablation     UPPER GASTROINTESTINAL ENDOSCOPY      Social History   Socioeconomic History   Marital status: Married    Spouse name: Carollee Herter   Number of children: 3   Years of education: Not on file   Highest education level: Not on file  Occupational History   Occupation: Heritage manager: Market researcher    Comment: Artist  Tobacco Use   Smoking status: Never    Passive exposure: Past (PARENTS SMOKED)   Smokeless tobacco: Never  Vaping Use   Vaping Use: Never used  Substance and Sexual Activity   Alcohol use: No   Drug use: No   Sexual activity: Yes    Partners: Female    Birth control/protection: None  Other Topics Concern   Not on file  Social History Narrative   Married second time   1 daughter born 25 from first marriage   1 son born 2002 from second marriage, daughter born 2006 from second marriage   He is a Optometrist   Wife is an Charity fundraiser on the life care ambulance with [place]   4 caffeinated beverages daily   12/12/2016   Social Determinants of Health   Financial Resource Strain: Not on file  Food Insecurity: Not on file  Transportation Needs: Not on file  Physical Activity: Not on file  Stress: Not on file  Social Connections: Not on file    Family History  Problem Relation Age of Onset   Ulcerative colitis Mother    Heart attack Mother        smoker   COPD Mother    Hypertension Mother    Emphysema Father         smoker   Heart disease Father    Colon cancer Neg Hx    Stomach cancer Neg Hx    Esophageal cancer Neg Hx    Rectal cancer Neg Hx    Colon polyps Neg Hx    Crohn's disease Neg Hx     Health Maintenance  Topic Date Due   COVID-19 Vaccine (4 - 2023-24 season) 08/03/2023 (Originally 05/05/2022)   INFLUENZA VACCINE  04/05/2023   DTaP/Tdap/Td (2 - Td or Tdap) 05/04/2026   COLONOSCOPY (Pts 45-30yrs Insurance coverage will need to be confirmed)  06/21/2027   Hepatitis C Screening  Completed   Zoster Vaccines- Shingrix  Completed   HPV VACCINES  Aged Out   HIV Screening  Discontinued     ----------------------------------------------------------------------------------------------------------------------------------------------------------------------------------------------------------------- Physical Exam BP 133/78 (BP Location: Left Arm, Patient Position: Sitting, Cuff Size: Large)   Pulse 70   Ht 5\' 5"  (1.651 m)   Wt 236 lb (107 kg)   SpO2 97%   BMI 39.27 kg/m   Physical Exam Constitutional:      General: He is not in acute distress. HENT:     Head: Normocephalic and atraumatic.     Right Ear: Tympanic membrane and external ear normal.     Left Ear: Tympanic membrane and external ear normal.  Eyes:     General: No scleral icterus. Neck:     Thyroid: No thyromegaly.  Cardiovascular:     Rate and Rhythm: Normal rate and regular rhythm.     Heart sounds: Normal heart sounds.  Pulmonary:     Effort: Pulmonary effort is normal.     Breath sounds: Normal breath sounds.  Abdominal:     General: Bowel sounds are normal. There is no distension.     Palpations: Abdomen is soft.     Tenderness: There is no abdominal tenderness. There is no guarding.  Musculoskeletal:     Cervical back: Normal range of motion.  Lymphadenopathy:     Cervical: No cervical adenopathy.  Skin:    General: Skin is warm and dry.     Findings: No rash.  Neurological:     Mental Status: He is alert  and oriented to person, place, and time.     Cranial Nerves: No cranial nerve deficit.     Motor: No abnormal muscle tone.  Psychiatric:        Mood and Affect: Mood normal.        Behavior: Behavior normal.     ------------------------------------------------------------------------------------------------------------------------------------------------------------------------------------------------------------------- Assessment and Plan  Well adult exam Well adult Orders Placed This Encounter  Procedures  COMPLETE METABOLIC PANEL WITH GFR   CBC with Differential   Lipid Panel w/reflex Direct LDL   TSH   PSA  Screenings: per lab orders Immunizations:  UTD Anticipatory guidance/Risk factor reduction:  Recommendations per AVS.    No orders of the defined types were placed in this encounter.   No follow-ups on file.    This visit occurred during the SARS-CoV-2 public health emergency.  Safety protocols were in place, including screening questions prior to the visit, additional usage of staff PPE, and extensive cleaning of exam room while observing appropriate contact time as indicated for disinfecting solutions.

## 2023-01-16 LAB — LIPID PANEL W/REFLEX DIRECT LDL
Cholesterol: 223 mg/dL — ABNORMAL HIGH (ref <200)
HDL: 58 mg/dL (ref >=40)
LDL Cholesterol (Calc): 139 mg/dL (calc) — ABNORMAL HIGH
Non-HDL Cholesterol (Calc): 165 mg/dL (calc) — ABNORMAL HIGH (ref <130)
Total CHOL/HDL Ratio: 3.8 (calc) (ref <5.0)
Triglycerides: 140 mg/dL (ref <150)

## 2023-01-16 LAB — COMPLETE METABOLIC PANEL WITH GFR
AG Ratio: 1.7 (calc) (ref 1.0–2.5)
ALT: 16 U/L (ref 9–46)
AST: 14 U/L (ref 10–35)
Albumin: 4.2 g/dL (ref 3.6–5.1)
Alkaline phosphatase (APISO): 67 U/L (ref 35–144)
BUN: 18 mg/dL (ref 7–25)
CO2: 28 mmol/L (ref 20–32)
Calcium: 9.3 mg/dL (ref 8.6–10.3)
Chloride: 106 mmol/L (ref 98–110)
Creat: 0.92 mg/dL (ref 0.70–1.30)
Globulin: 2.5 g/dL (calc) (ref 1.9–3.7)
Glucose, Bld: 103 mg/dL — ABNORMAL HIGH (ref 65–99)
Potassium: 4.4 mmol/L (ref 3.5–5.3)
Sodium: 140 mmol/L (ref 135–146)
Total Bilirubin: 0.4 mg/dL (ref 0.2–1.2)
Total Protein: 6.7 g/dL (ref 6.1–8.1)
eGFR: 96 mL/min/{1.73_m2} (ref >=60)

## 2023-01-16 LAB — CBC WITH DIFFERENTIAL/PLATELET
Absolute Monocytes: 454 cells/uL (ref 200–950)
Basophils Absolute: 41 cells/uL (ref 0–200)
Basophils Relative: 0.7 %
Eosinophils Absolute: 342 cells/uL (ref 15–500)
Eosinophils Relative: 5.8 %
HCT: 42.9 % (ref 38.5–50.0)
Hemoglobin: 14.5 g/dL (ref 13.2–17.1)
Lymphs Abs: 2118 cells/uL (ref 850–3900)
MCH: 29.9 pg (ref 27.0–33.0)
MCHC: 33.8 g/dL (ref 32.0–36.0)
MCV: 88.5 fL (ref 80.0–100.0)
MPV: 9.2 fL (ref 7.5–12.5)
Monocytes Relative: 7.7 %
Neutro Abs: 2944 cells/uL (ref 1500–7800)
Neutrophils Relative %: 49.9 %
Platelets: 281 10*3/uL (ref 140–400)
RBC: 4.85 10*6/uL (ref 4.20–5.80)
RDW: 11.9 % (ref 11.0–15.0)
Total Lymphocyte: 35.9 %
WBC: 5.9 10*3/uL (ref 3.8–10.8)

## 2023-01-16 LAB — TSH: TSH: 1.89 mIU/L (ref 0.40–4.50)

## 2023-01-16 LAB — PSA: PSA: 0.38 ng/mL (ref <=4.00)

## 2023-01-23 ENCOUNTER — Encounter: Payer: Self-pay | Admitting: Family Medicine

## 2023-01-24 ENCOUNTER — Other Ambulatory Visit: Payer: Self-pay | Admitting: Family Medicine

## 2023-01-24 MED ORDER — MONTELUKAST SODIUM 10 MG PO TABS
10.00 mg | ORAL_TABLET | Freq: Every day | ORAL | 3 refills | Status: DC
Start: 2023-01-24 — End: 2023-03-09

## 2023-03-08 ENCOUNTER — Encounter: Payer: Self-pay | Admitting: Family Medicine

## 2023-03-09 ENCOUNTER — Other Ambulatory Visit (HOSPITAL_COMMUNITY): Payer: Self-pay

## 2023-03-09 MED ORDER — MONTELUKAST SODIUM 10 MG PO TABS
10.0000 mg | ORAL_TABLET | Freq: Every day | ORAL | 3 refills | Status: DC
  Filled 2023-03-09: qty 30, 30d supply, fill #0
  Filled 2023-04-16: qty 30, 30d supply, fill #1
  Filled 2023-05-20: qty 30, 30d supply, fill #2
  Filled 2023-06-15: qty 30, 30d supply, fill #3

## 2023-05-09 ENCOUNTER — Telehealth: Payer: 59 | Admitting: Physician Assistant

## 2023-05-09 DIAGNOSIS — K047 Periapical abscess without sinus: Secondary | ICD-10-CM | POA: Diagnosis not present

## 2023-05-09 MED ORDER — CLINDAMYCIN HCL 300 MG PO CAPS
300.0000 mg | ORAL_CAPSULE | Freq: Three times a day (TID) | ORAL | 0 refills | Status: AC
Start: 2023-05-09 — End: 2023-05-16

## 2023-05-09 MED ORDER — NAPROXEN 500 MG PO TABS
500.0000 mg | ORAL_TABLET | Freq: Two times a day (BID) | ORAL | 0 refills | Status: AC
Start: 2023-05-09 — End: ?

## 2023-05-09 NOTE — Progress Notes (Signed)
I have spent 5 minutes in review of e-visit questionnaire, review and updating patient chart, medical decision making and response to patient.   William Cody Martin, PA-C    

## 2023-05-09 NOTE — Progress Notes (Signed)

## 2023-07-13 ENCOUNTER — Other Ambulatory Visit: Payer: Self-pay | Admitting: Family Medicine

## 2023-07-17 ENCOUNTER — Other Ambulatory Visit: Payer: Self-pay

## 2023-07-17 ENCOUNTER — Other Ambulatory Visit (HOSPITAL_COMMUNITY): Payer: Self-pay

## 2023-07-17 MED ORDER — MONTELUKAST SODIUM 10 MG PO TABS
10.0000 mg | ORAL_TABLET | Freq: Every day | ORAL | 3 refills | Status: DC
  Filled 2023-07-17: qty 30, 30d supply, fill #0
  Filled 2023-07-30 – 2023-09-11 (×2): qty 30, 30d supply, fill #1
  Filled 2023-10-07: qty 30, 30d supply, fill #2
  Filled 2023-11-05: qty 30, 30d supply, fill #3

## 2023-07-30 ENCOUNTER — Other Ambulatory Visit: Payer: Self-pay

## 2023-09-11 ENCOUNTER — Other Ambulatory Visit: Payer: Self-pay | Admitting: Family Medicine

## 2023-09-11 MED ORDER — IPRATROPIUM BROMIDE 0.03 % NA SOLN
2.0000 | Freq: Two times a day (BID) | NASAL | 12 refills | Status: AC
  Filled 2023-09-11: qty 30, 43d supply, fill #0
  Filled 2023-10-21: qty 30, 43d supply, fill #1
  Filled 2023-11-05 – 2024-01-01 (×2): qty 30, 43d supply, fill #2

## 2023-09-12 ENCOUNTER — Other Ambulatory Visit (HOSPITAL_COMMUNITY): Payer: Self-pay

## 2023-11-05 ENCOUNTER — Other Ambulatory Visit: Payer: Self-pay

## 2023-11-12 ENCOUNTER — Other Ambulatory Visit (HOSPITAL_COMMUNITY): Payer: Self-pay

## 2023-11-12 MED ORDER — CLINDAMYCIN HCL 150 MG PO CAPS
150.0000 mg | ORAL_CAPSULE | Freq: Three times a day (TID) | ORAL | 0 refills | Status: DC
  Filled 2023-11-12: qty 30, 10d supply, fill #0

## 2023-11-27 ENCOUNTER — Telehealth (INDEPENDENT_AMBULATORY_CARE_PROVIDER_SITE_OTHER): Payer: Self-pay | Admitting: Otolaryngology

## 2023-11-27 NOTE — Telephone Encounter (Signed)
 Confirmed appt & location [id number] afm

## 2023-11-28 ENCOUNTER — Encounter (INDEPENDENT_AMBULATORY_CARE_PROVIDER_SITE_OTHER): Payer: Self-pay

## 2023-11-28 ENCOUNTER — Ambulatory Visit (INDEPENDENT_AMBULATORY_CARE_PROVIDER_SITE_OTHER): Admitting: Otolaryngology

## 2023-11-28 VITALS — BP 144/86 | HR 75 | Ht 65.0 in | Wt 236.0 lb

## 2023-11-28 DIAGNOSIS — J343 Hypertrophy of nasal turbinates: Secondary | ICD-10-CM

## 2023-11-28 DIAGNOSIS — J329 Chronic sinusitis, unspecified: Secondary | ICD-10-CM

## 2023-11-28 DIAGNOSIS — J338 Other polyp of sinus: Secondary | ICD-10-CM | POA: Diagnosis not present

## 2023-11-28 DIAGNOSIS — J324 Chronic pansinusitis: Secondary | ICD-10-CM

## 2023-11-28 MED ORDER — PREDNISONE 10 MG (48) PO TBPK: ORAL_TABLET | ORAL | 0 refills | Status: DC

## 2023-11-28 MED ORDER — FLUTICASONE PROPIONATE 50 MCG/ACT NA SUSP: 2.0000 | Freq: Every day | NASAL | 10 refills | Status: DC

## 2023-11-29 DIAGNOSIS — J338 Other polyp of sinus: Secondary | ICD-10-CM | POA: Insufficient documentation

## 2023-11-29 DIAGNOSIS — J343 Hypertrophy of nasal turbinates: Secondary | ICD-10-CM | POA: Insufficient documentation

## 2023-11-29 NOTE — Progress Notes (Signed)
 Patient ID: Thomas Gates, male   DOB: 17-Nov-1960, 63 y.o.   MRN: [id number]  CC: Chronic rhinosinusitis, chronic nasal obstruction  HPI:  Thomas Gates is a 63 y.o. male who presents today complaining of chronic rhinosinusitis and chronic nasal obstruction.  According to the patient, he has a history of bilateral chronic rhinosinusitis for 20+ years.  He underwent bilateral sinus surgery in 2014 by Dr. Annalee Genta.  His symptoms initially improved.  However, he has noted increasing nasal obstruction, facial pain and pressure, and nasal drainage over the past year.  He was treated with multiple courses of antibiotics.  His last antibiotic was 1 week ago.  He has a history of environmental allergies.  He is currently on Zyrtec, Singulair, and Atrovent.  He has no other ENT surgery.  Past Medical History:  Diagnosis Date   Allergy    Arthritis    rt hand   CHF (congestive heart failure) (HCC)    hx SVT, dx CHF while in hospital for that ?due to morphine or IV fluids    Chronic sinusitis    Deviated septum    Dysrhythmia    SVT   ED (erectile dysfunction)    GERD (gastroesophageal reflux disease)    Hx of adenomatous polyp of colon 03/02/2017   Hyperlipidemia    Nasal polyps    Supraventricular tachycardia (HCC) 1999    Past Surgical History:  Procedure Laterality Date   COLONOSCOPY     COLONOSCOPY W/ BIOPSIES     HAND SURGERY  1999   NASAL SEPTOPLASTY W/ TURBINOPLASTY Bilateral 05/09/2013   Procedure: NASAL SEPTOPLASTY AND BILATERAL INFERIOR TURBINATE REDUCTION, ;  Surgeon: Osborn Coho, MD;  Location: [facility];  Service: ENT;  Laterality: Bilateral;   POLYPECTOMY     SINUS ENDO W/FUSION Bilateral 05/09/2013   Procedure: BILATERAL ENDOSCOPIC SINUS SURGERY WITH FUSION NAVIGATION;  Surgeon: Osborn Coho, MD;  Location: [facility];  Service: ENT;  Laterality: Bilateral;   SKIN GRAFT     right hand   svt ablation     UPPER GASTROINTESTINAL  ENDOSCOPY      Family History  Problem Relation Age of Onset   Ulcerative colitis Mother    Heart attack Mother        smoker   COPD Mother    Hypertension Mother    Emphysema Father        smoker   Heart disease Father    Colon cancer Neg Hx    Stomach cancer Neg Hx    Esophageal cancer Neg Hx    Rectal cancer Neg Hx    Colon polyps Neg Hx    Crohn's disease Neg Hx     Social History:  reports that he has never smoked. He has been exposed to tobacco smoke. He has never used smokeless tobacco. He reports that he does not drink alcohol and does not use drugs.  Allergies:  Allergies  Allergen Reactions   Penicillins Shortness Of Breath    Rash also    Prior to Admission medications   Medication Sig Start Date End Date Taking? Authorizing Provider  AMBULATORY NON FORMULARY MEDICATION CBD Oil BID   Yes [provider]  Ascorbic Acid (VITAMIN C) 1000 MG tablet Take 1,000 mg by mouth daily.   Yes [provider]  cetirizine (ZYRTEC) 10 MG tablet Take 1 tablet (10 mg total) by mouth daily. 02/18/18  Yes Sunnie Nielsen, DO  clindamycin (CLEOCIN) 150 MG capsule Take 1  capsule (150 mg total) by mouth 3 (three) times daily. 11/12/23  Yes   diphenhydrAMINE HCl (BENADRYL ALLERGY PO) Take by mouth as needed.   Yes [provider]  fish oil-omega-3 fatty acids 1000 MG capsule Take 2 g by mouth daily.   Yes [provider]  fluticasone (FLONASE) 50 MCG/ACT nasal spray Place 2 sprays into both nostrils daily. 11/28/23 12/28/23 Yes Newman Pies, MD  ipratropium (ATROVENT) 0.03 % nasal spray Place 2 sprays into both nostrils every 12 (twelve) hours. 09/11/23  Yes Everrett Coombe, DO  MAGNESIUM PO Take by mouth at bedtime.   Yes [provider]  montelukast (SINGULAIR) 10 MG tablet Take 1 tablet (10 mg total) by mouth at bedtime. 07/17/23  Yes Everrett Coombe, DO  naproxen (NAPROSYN) 500 MG tablet Take 1 tablet (500 mg total) by mouth 2 (two) times daily  with a meal. 05/09/23  Yes Waldon Merl, PA-C  OVER THE COUNTER MEDICATION RED YEAST RICE TWICE A DAY BEFORE MEALS   Yes [provider]  predniSONE (STERAPRED UNI-PAK 48 TAB) 10 MG (48) TBPK tablet As instructed (6,6,6,6,4,4,4,4,2,2,2,2) 11/28/23  Yes Newman Pies, MD  sildenafil (VIAGRA) 50 MG tablet Take 0.5-2 tablets (25-100 mg total) by mouth as needed for erectile dysfunction (Use lowest effective dose). 01/12/21  Yes Sunnie Nielsen, DO    Blood pressure (!) 144/86, pulse 75, height 5\' 5"  (1.651 m), weight 236 lb (107 kg), SpO2 93%. Exam: General: Communicates without difficulty, well nourished, no acute distress. Head: Normocephalic, no evidence injury, no tenderness, facial buttresses intact without stepoff. Face/sinus: No tenderness to palpation and percussion. Facial movement is normal and symmetric. Eyes: PERRL, EOMI. No scleral icterus, conjunctivae clear. Neuro: CN II exam reveals vision grossly intact.  No nystagmus at any point of gaze. Ears: Auricles well formed without lesions.  Ear canals are intact without mass or lesion.  No erythema or edema is appreciated.  The TMs are intact without fluid. Nose: External evaluation reveals normal support and skin without lesions.  Dorsum is intact.  Anterior rhinoscopy reveals congested mucosa over anterior aspect of inferior turbinates and intact septum.  Polypoid tissue is noted bilaterally.  Oral:  Oral cavity and oropharynx are intact, symmetric, without erythema or edema.  Mucosa is moist without lesions. Neck: Full range of motion without pain.  There is no significant lymphadenopathy.  No masses palpable.  Thyroid bed within normal limits to palpation.  Parotid glands and submandibular glands equal bilaterally without mass.  Trachea is midline. Neuro:  CN 2-12 grossly intact.   Procedure:  Flexible Nasal Endoscopy: Description: Risks, benefits, and alternatives of flexible endoscopy were explained to the patient.  Specific mention  was made of the risk of throat numbness with difficulty swallowing, possible bleeding from the nose and mouth, and pain from the procedure.  The patient gave oral consent to proceed.  The flexible scope was inserted into the right nasal cavity.  Endoscopy of the interior nasal cavity, superior, inferior, and middle meatus was performed. The sphenoid-ethmoid recess was examined. Edematous mucosa was noted.  Polypoid tissue was noted to obstruct the middle meatus and the nasal cavity. Turbinates were hypertrophied but without mass.  The procedure was repeated on the contralateral side with similar findings.  The patient tolerated the procedure well.   Assessment: 1.  Bilateral chronic rhinosinusitis and polyposis.  His middle meatus and nasal cavities are both obstructed by large polyps. 2.  Bilateral inferior turbinate hypertrophy.  Plan: 1.  The physical exam  and nasal endoscopy findings are reviewed with the patient. 2.  High-dose prednisone for 12 days. 3.  Flonase nasal spray 2 sprays each nostril daily. 4.  The patient will return for reevaluation in 2 weeks.  If he continues to have significant polyposis, we will obtain a sinus CT scan at that time.  Iceis Knab W Goran Olden 11/29/2023, 1:07 PM

## 2023-12-03 ENCOUNTER — Other Ambulatory Visit (HOSPITAL_COMMUNITY): Payer: Self-pay

## 2023-12-03 ENCOUNTER — Other Ambulatory Visit: Payer: Self-pay | Admitting: Family Medicine

## 2023-12-03 MED ORDER — MONTELUKAST SODIUM 10 MG PO TABS
10.0000 mg | ORAL_TABLET | Freq: Every day | ORAL | 3 refills | Status: DC
  Filled 2023-12-03: qty 30, 30d supply, fill #0
  Filled 2024-01-01: qty 30, 30d supply, fill #1
  Filled 2024-02-19: qty 30, 30d supply, fill #2
  Filled 2024-03-03 – 2024-03-15 (×2): qty 30, 30d supply, fill #3

## 2023-12-04 ENCOUNTER — Other Ambulatory Visit: Payer: Self-pay

## 2023-12-14 ENCOUNTER — Encounter (INDEPENDENT_AMBULATORY_CARE_PROVIDER_SITE_OTHER): Payer: Self-pay

## 2023-12-14 ENCOUNTER — Ambulatory Visit (INDEPENDENT_AMBULATORY_CARE_PROVIDER_SITE_OTHER): Admitting: Otolaryngology

## 2023-12-14 VITALS — BP 164/80 | HR 81 | Ht 65.0 in | Wt 236.0 lb

## 2023-12-14 DIAGNOSIS — J343 Hypertrophy of nasal turbinates: Secondary | ICD-10-CM | POA: Diagnosis not present

## 2023-12-14 DIAGNOSIS — J324 Chronic pansinusitis: Secondary | ICD-10-CM

## 2023-12-14 DIAGNOSIS — J338 Other polyp of sinus: Secondary | ICD-10-CM

## 2023-12-14 DIAGNOSIS — J329 Chronic sinusitis, unspecified: Secondary | ICD-10-CM | POA: Diagnosis not present

## 2023-12-15 NOTE — Progress Notes (Signed)
 Patient ID: Thomas Gates, male   DOB: 1961/03/08, 63 y.o.   MRN: [id number]  Follow-up: Chronic nasal obstruction, chronic rhinosinusitis  HPI: The patient is a 63 year old male who returns today for his follow-up evaluation.  He was last seen in March 2025.  At that time, he was complaining of bilateral chronic rhinosinusitis and bilateral nasal obstruction.  He previously underwent bilateral sinus surgery in 2014 by Dr. Melissa Spring.  At his last visit, he was noted to have bilateral sinonasal polyps.  His middle meatus and nasal cavities were obstructed by large polyps.  He was also noted to have bilateral inferior turbinate hypertrophy.  The patient was treated with high-dose prednisone for 12 days and Flonase nasal spray daily.  The patient returns today reporting significant improvement in his nasal breathing.  Currently he denies any facial pain, fever, or visual change.  Exam: General: Communicates without difficulty, well nourished, no acute distress. Head: Normocephalic, no evidence injury, no tenderness, facial buttresses intact without stepoff. Face/sinus: No tenderness to palpation and percussion. Facial movement is normal and symmetric. Eyes: PERRL, EOMI. No scleral icterus, conjunctivae clear. Neuro: CN II exam reveals vision grossly intact.  No nystagmus at any point of gaze. Ears: Auricles well formed without lesions.  Ear canals are intact without mass or lesion.  No erythema or edema is appreciated.  The TMs are intact without fluid. Nose: External evaluation reveals normal support and skin without lesions.  Dorsum is intact.  Anterior rhinoscopy reveals congested mucosa over anterior aspect of inferior turbinates and intact septum.  Small polypoid tissue is noted bilaterally.  Oral:  Oral cavity and oropharynx are intact, symmetric, without erythema or edema.  Mucosa is moist without lesions. Neck: Full range of motion without pain.  There is no significant lymphadenopathy.  No masses  palpable.  Thyroid bed within normal limits to palpation.  Parotid glands and submandibular glands equal bilaterally without mass.  Trachea is midline. Neuro:  CN 2-12 grossly intact.   Assessment: 1.  Bilateral chronic rhinosinusitis and polyposis.  The severity and size of his polyposis has significantly decreased after medical treatment with high-dose steroid. 2.  Bilateral inferior turbinate hypertrophy.  Plan: 1.  The physical exam findings are reviewed with the patient. 2.  Continue with Flonase nasal spray 2 sprays each nostril daily.  The importance of consistent daily use is discussed. 3.  The patient will return for reevaluation in 3 months.

## 2024-01-08 ENCOUNTER — Other Ambulatory Visit (HOSPITAL_COMMUNITY): Payer: Self-pay

## 2024-01-08 ENCOUNTER — Other Ambulatory Visit: Payer: Self-pay

## 2024-01-08 MED ORDER — FLUTICASONE PROPIONATE 50 MCG/ACT NA SUSP
1.0000 | Freq: Every day | NASAL | 9 refills | Status: AC
  Filled 2024-01-08: qty 16, 30d supply, fill #0
  Filled 2024-02-02: qty 16, 30d supply, fill #1
  Filled 2024-02-19 – 2024-03-03 (×2): qty 16, 30d supply, fill #2
  Filled 2024-04-13: qty 16, 30d supply, fill #3
  Filled 2024-05-09: qty 16, 30d supply, fill #4
  Filled 2024-06-24: qty 16, 30d supply, fill #5
  Filled 2024-07-27: qty 16, 30d supply, fill #6
  Filled 2024-08-23: qty 16, 30d supply, fill #7
  Filled 2024-09-15: qty 16, 30d supply, fill #8
  Filled 2024-10-07: qty 32, 90d supply, fill #8

## 2024-01-08 MED ORDER — FLUTICASONE PROPIONATE 50 MCG/ACT NA SUSP
2.0000 | Freq: Every day | NASAL | 10 refills | Status: DC
Start: 2023-11-28 — End: 2024-02-25
  Filled 2024-01-08: qty 16, 30d supply, fill #0

## 2024-01-31 ENCOUNTER — Encounter: Admitting: Family Medicine

## 2024-02-02 ENCOUNTER — Other Ambulatory Visit (HOSPITAL_COMMUNITY): Payer: Self-pay

## 2024-02-04 ENCOUNTER — Other Ambulatory Visit (HOSPITAL_COMMUNITY): Payer: Self-pay

## 2024-02-19 ENCOUNTER — Other Ambulatory Visit (HOSPITAL_COMMUNITY): Payer: Self-pay

## 2024-02-19 ENCOUNTER — Other Ambulatory Visit: Payer: Self-pay

## 2024-02-25 ENCOUNTER — Ambulatory Visit: Admitting: Family Medicine

## 2024-02-25 ENCOUNTER — Encounter: Payer: Self-pay | Admitting: Family Medicine

## 2024-02-25 VITALS — BP 135/87 | HR 68 | Ht 65.0 in | Wt 245.0 lb

## 2024-02-25 DIAGNOSIS — R030 Elevated blood-pressure reading, without diagnosis of hypertension: Secondary | ICD-10-CM | POA: Insufficient documentation

## 2024-02-25 DIAGNOSIS — J338 Other polyp of sinus: Secondary | ICD-10-CM

## 2024-02-25 DIAGNOSIS — Z125 Encounter for screening for malignant neoplasm of prostate: Secondary | ICD-10-CM | POA: Diagnosis not present

## 2024-02-25 DIAGNOSIS — R002 Palpitations: Secondary | ICD-10-CM | POA: Insufficient documentation

## 2024-02-25 NOTE — Progress Notes (Signed)
 Thomas Gates - 63 y.o. male MRN [id number]  Date of birth: [birth year]  Subjective Chief Complaint  Patient presents with   Chest Pain   Hypertension   Insomnia    HPI Thomas Gates is a 63 y.o. male here today with concern of elevated blood pressure.  He has noted BP readings have been a little elevated at times at home.  He has had some intermittent episodes of chest pain and palpitations.  He attributes chest pain to his job and lifting heavy things.  He has been dealing with nasal polyps and is seeing ENT.  He has had a couple instances that he has woken up and had difficulty breathing.  This makes him feel panicky and this seems to trigger the elevated BP and heart rate.  Denies radiation of pain.  Denies exertional dyspnea.    He is also concerned about weight gain.  Weight is up about 10lbs over the past year.  Admits to eating late at night and not making the best choices about food at times.  He feels that his activity level remains pretty good.   ROS:  A comprehensive ROS was completed and negative except as noted per HPI  Allergies  Allergen Reactions   Penicillins Shortness Of Breath    Rash also    Past Medical History:  Diagnosis Date   Allergy    Arthritis    rt hand   CHF (congestive heart failure) (HCC)    hx SVT, dx CHF while in hospital for that ?due to morphine or IV fluids    Chronic sinusitis    Deviated septum    Dysrhythmia    SVT   ED (erectile dysfunction)    GERD (gastroesophageal reflux disease)    Hx of adenomatous polyp of colon 03/02/2017   Hyperlipidemia    Nasal polyps    Supraventricular tachycardia (HCC) 1999    Past Surgical History:  Procedure Laterality Date   COLONOSCOPY     COLONOSCOPY W/ BIOPSIES     HAND SURGERY  1999   NASAL SEPTOPLASTY W/ TURBINOPLASTY Bilateral 05/09/2013   Procedure: NASAL SEPTOPLASTY AND BILATERAL INFERIOR TURBINATE REDUCTION, ;  Surgeon: Alm Bouche, MD;  Location: [facility];   Service: ENT;  Laterality: Bilateral;   POLYPECTOMY     SINUS ENDO W/FUSION Bilateral 05/09/2013   Procedure: BILATERAL ENDOSCOPIC SINUS SURGERY WITH FUSION NAVIGATION;  Surgeon: Alm Bouche, MD;  Location: [facility];  Service: ENT;  Laterality: Bilateral;   SKIN GRAFT     right hand   svt ablation     UPPER GASTROINTESTINAL ENDOSCOPY      Social History   Socioeconomic History   Marital status: Married    Spouse name: Clotilda   Number of children: 3   Years of education: Not on file   Highest education level: 12th grade  Occupational History   Occupation: Heritage manager: Market researcher    Comment: Artist  Tobacco Use   Smoking status: Never    Passive exposure: Past (PARENTS SMOKED)   Smokeless tobacco: Never  Vaping Use   Vaping status: Never Used  Substance and Sexual Activity   Alcohol use: No   Drug use: No   Sexual activity: Yes    Partners: Female    Birth control/protection: None  Other Topics Concern   Not on file  Social History Narrative   Married second time   1 daughter born 73 from first  marriage   1 son born 2002 from second marriage, daughter born 2006 from second marriage   He is a Optometrist   Wife is an Charity fundraiser on the life care ambulance with [employer]   4 caffeinated beverages daily   12/12/2016   Social Drivers of Health   Financial Resource Strain: Low Risk  (02/21/2024)   Overall Financial Resource Strain (CARDIA)    Difficulty of Paying Living Expenses: Not hard at all  Food Insecurity: No Food Insecurity (02/21/2024)   Hunger Vital Sign    Worried About Running Out of Food in the Last Year: Never true    Ran Out of Food in the Last Year: Never true  Transportation Needs: No Transportation Needs (02/21/2024)   PRAPARE - Administrator, Civil Service (Medical): No    Lack of Transportation (Non-Medical): No  Physical Activity: Inactive (02/21/2024)   Exercise Vital Sign    Days  of Exercise per Week: 0 days    Minutes of Exercise per Session: Not on file  Stress: Stress Concern Present (02/21/2024)   [employer] of Occupational Health - Occupational Stress Questionnaire    Feeling of Stress: Rather much  Social Connections: Moderately Isolated (02/21/2024)   Social Connection and Isolation Panel    Frequency of Communication with Friends and Family: More than three times a week    Frequency of Social Gatherings with Friends and Family: More than three times a week    Attends Religious Services: Never    Database administrator or Organizations: No    Attends Engineer, structural: Not on file    Marital Status: Married    Family History  Problem Relation Age of Onset   Ulcerative colitis Mother    Heart attack Mother        smoker   COPD Mother    Hypertension Mother    Emphysema Father        smoker   Heart disease Father    Colon cancer Neg Hx    Stomach cancer Neg Hx    Esophageal cancer Neg Hx    Rectal cancer Neg Hx    Colon polyps Neg Hx    Crohn's disease Neg Hx     Health Maintenance  Topic Date Due   COVID-19 Vaccine (4 - 2024-25 season) 05/06/2023   INFLUENZA VACCINE  04/04/2024   DTaP/Tdap/Td (2 - Td or Tdap) 05/04/2026   Colonoscopy  06/21/2027   Hepatitis C Screening  Completed   Zoster Vaccines- Shingrix  Completed   HPV VACCINES  Aged Out   Meningococcal B Vaccine  Aged Out   HIV Screening  Discontinued     ----------------------------------------------------------------------------------------------------------------------------------------------------------------------------------------------------------------- Physical Exam BP 135/87 (BP Location: Left Arm, Patient Position: Sitting, Cuff Size: Large)   Pulse 68   Ht 5' 5 (1.651 m)   Wt 245 lb (111.1 kg)   SpO2 96%   BMI 40.77 kg/m   Physical Exam Constitutional:      Appearance: He is well-developed.   Eyes:     General: No scleral  icterus.   Cardiovascular:     Rate and Rhythm: Normal rate and regular rhythm.  Pulmonary:     Effort: Pulmonary effort is normal.     Breath sounds: Normal breath sounds.   Musculoskeletal:     Cervical back: Neck supple.   Neurological:     General: No focal deficit present.     Mental Status: He is alert.   Psychiatric:  Mood and Affect: Mood normal.        Behavior: Behavior normal.     EKG: normal EKG, normal sinus rhythm,incomplete RBBB.  No significant change from previous tracing.   ------------------------------------------------------------------------------------------------------------------------------------------------------------------------------------------------------------------- Assessment and Plan  Polyp of nasal sinus Has nasal polyps and may require additional surgery for this.  Currently using flonase .    Elevated BP without diagnosis of hypertension BP is pretty well controlled in clinic.  We discussed low sodium diet and recommend continued monitoring at home.  Red flags reviewed.   Palpitations Occasional episodes of chest pain and tachycardia, usually when feeling stressed.  EKG is relatively unremarkable.  Coronary calcium  score ordered and will update labs for risk stratification.     No orders of the defined types were placed in this encounter.   No follow-ups on file.

## 2024-02-25 NOTE — Patient Instructions (Signed)
 BP looks ok here today.  Continue to monitor at home.  Low sodium diet   Avoid eating too late in the evening.  Stay well hydrated.   Magnesium glycinate 250-500mg  nightly for sleep.

## 2024-02-25 NOTE — Assessment & Plan Note (Addendum)
 BP is pretty well controlled in clinic.  We discussed low sodium diet and recommend continued monitoring at home.  Red flags reviewed.

## 2024-02-25 NOTE — Assessment & Plan Note (Signed)
 Has nasal polyps and may require additional surgery for this.  Currently using flonase .

## 2024-02-25 NOTE — Assessment & Plan Note (Signed)
 Occasional episodes of chest pain and tachycardia, usually when feeling stressed.  EKG is relatively unremarkable.  Coronary calcium  score ordered and will update labs for risk stratification.

## 2024-02-26 LAB — CBC WITH DIFFERENTIAL/PLATELET
Basophils Absolute: 0.1 10*3/uL (ref 0.0–0.2)
Basos: 1 %
EOS (ABSOLUTE): 0.3 10*3/uL (ref 0.0–0.4)
Eos: 5 %
Hematocrit: 44.2 % (ref 37.5–51.0)
Hemoglobin: 14.7 g/dL (ref 13.0–17.7)
Immature Grans (Abs): 0 10*3/uL (ref 0.0–0.1)
Immature Granulocytes: 0 %
Lymphocytes Absolute: 2 10*3/uL (ref 0.7–3.1)
Lymphs: 35 %
MCH: 30.4 pg (ref 26.6–33.0)
MCHC: 33.3 g/dL (ref 31.5–35.7)
MCV: 91 fL (ref 79–97)
Monocytes Absolute: 0.5 10*3/uL (ref 0.1–0.9)
Monocytes: 8 %
Neutrophils Absolute: 3 10*3/uL (ref 1.4–7.0)
Neutrophils: 51 %
Platelets: 270 10*3/uL (ref 150–450)
RBC: 4.84 x10E6/uL (ref 4.14–5.80)
RDW: 12.2 % (ref 11.6–15.4)
WBC: 5.9 10*3/uL (ref 3.4–10.8)

## 2024-02-26 LAB — LIPID PANEL WITH LDL/HDL RATIO
Cholesterol, Total: 214 mg/dL — ABNORMAL HIGH (ref 100–199)
HDL: 54 mg/dL (ref >39)
LDL Chol Calc (NIH): 135 mg/dL — ABNORMAL HIGH (ref 0–99)
LDL/HDL Ratio: 2.5 ratio (ref 0.0–3.6)
Triglycerides: 141 mg/dL (ref 0–149)
VLDL Cholesterol Cal: 25 mg/dL (ref 5–40)

## 2024-02-26 LAB — MAGNESIUM: Magnesium: 1.9 mg/dL (ref 1.6–2.3)

## 2024-02-26 LAB — CMP14+EGFR
ALT: 19 IU/L (ref 0–44)
AST: 15 IU/L (ref 0–40)
Albumin: 4.3 g/dL (ref 3.8–4.9)
Alkaline Phosphatase: 84 IU/L (ref 44–121)
BUN/Creatinine Ratio: 17 (ref 10–24)
BUN: 18 mg/dL (ref 8–27)
Bilirubin Total: 0.5 mg/dL (ref 0.0–1.2)
CO2: 22 mmol/L (ref 20–29)
Calcium: 9.1 mg/dL (ref 8.6–10.2)
Chloride: 103 mmol/L (ref 96–106)
Creatinine, Ser: 1.06 mg/dL (ref 0.76–1.27)
Globulin, Total: 2.4 g/dL (ref 1.5–4.5)
Glucose: 101 mg/dL — ABNORMAL HIGH (ref 70–99)
Potassium: 4.1 mmol/L (ref 3.5–5.2)
Sodium: 141 mmol/L (ref 134–144)
Total Protein: 6.7 g/dL (ref 6.0–8.5)
eGFR: 80 mL/min/{1.73_m2} (ref >59)

## 2024-02-26 LAB — TSH: TSH: 1.81 u[IU]/mL (ref 0.450–4.500)

## 2024-02-26 LAB — PSA: Prostate Specific Ag, Serum: 0.5 ng/mL (ref 0.0–4.0)

## 2024-02-27 ENCOUNTER — Other Ambulatory Visit

## 2024-03-03 ENCOUNTER — Other Ambulatory Visit: Payer: Self-pay

## 2024-03-03 ENCOUNTER — Other Ambulatory Visit (HOSPITAL_COMMUNITY): Payer: Self-pay

## 2024-03-04 NOTE — Addendum Note (Signed)
 Addended by: BONNY JON DEL on: 03/04/2024 10:37 AM   Modules accepted: Orders

## 2024-03-09 ENCOUNTER — Ambulatory Visit: Payer: Self-pay | Admitting: Family Medicine

## 2024-03-10 ENCOUNTER — Other Ambulatory Visit

## 2024-03-14 ENCOUNTER — Ambulatory Visit (INDEPENDENT_AMBULATORY_CARE_PROVIDER_SITE_OTHER): Admitting: Otolaryngology

## 2024-03-14 ENCOUNTER — Encounter (INDEPENDENT_AMBULATORY_CARE_PROVIDER_SITE_OTHER): Payer: Self-pay | Admitting: Otolaryngology

## 2024-03-14 VITALS — BP 148/87 | HR 76

## 2024-03-14 DIAGNOSIS — J310 Chronic rhinitis: Secondary | ICD-10-CM | POA: Diagnosis not present

## 2024-03-14 DIAGNOSIS — J343 Hypertrophy of nasal turbinates: Secondary | ICD-10-CM | POA: Diagnosis not present

## 2024-03-14 DIAGNOSIS — J338 Other polyp of sinus: Secondary | ICD-10-CM

## 2024-03-14 DIAGNOSIS — J324 Chronic pansinusitis: Secondary | ICD-10-CM

## 2024-03-14 MED ORDER — PREDNISONE 10 MG (48) PO TBPK: ORAL_TABLET | ORAL | 0 refills | Status: AC

## 2024-03-15 ENCOUNTER — Other Ambulatory Visit (HOSPITAL_COMMUNITY): Payer: Self-pay

## 2024-03-15 NOTE — Progress Notes (Signed)
 Patient ID: Thomas Gates, male   DOB: 1960-09-25, 63 y.o.   MRN: [id number]  Follow-up: Chronic nasal obstruction, bilateral nasal polyps, chronic rhinosinusitis  HPI: The patient is a 63 year old male who returns today for his follow-up evaluation.  The patient has a history of chronic rhinosinusitis, bilateral nasal obstruction, and bilateral sinonasal polyps.  He underwent bilateral sinus surgery in 2014 by Dr. Mable.  Earlier this year, he was noted to have bilateral large polypoid tissue, obstructing his sinus and nasal cavities.  The severity of his polyposis decreased after he was treated with high-dose steroid.  At his last visit in April 2025, he was noted to have nasal mucosal congestion and bilateral inferior turbinate hypertrophy.  He was treated with Flonase  daily.  The patient returns today complaining of gradually increasing nasal congestion over the past month.  He denies any facial pain, fever, or visual change.  Exam: General: Communicates without difficulty, well nourished, no acute distress. Head: Normocephalic, no evidence injury, no tenderness, facial buttresses intact without stepoff. Face/sinus: No tenderness to palpation and percussion. Facial movement is normal and symmetric. Eyes: PERRL, EOMI. No scleral icterus, conjunctivae clear. Neuro: CN II exam reveals vision grossly intact.  No nystagmus at any point of gaze. Ears: Auricles well formed without lesions.  Ear canals are intact without mass or lesion.  No erythema or edema is appreciated.  The TMs are intact without fluid. Nose: External evaluation reveals normal support and skin without lesions.  Dorsum is intact.  Anterior rhinoscopy reveals congested mucosa over anterior aspect of inferior turbinates and intact septum.  Polypoid tissue is noted bilaterally.  Oral:  Oral cavity and oropharynx are intact, symmetric, without erythema or edema.  Mucosa is moist without lesions. Neck: Full range of motion without pain.   There is no significant lymphadenopathy.  No masses palpable.  Thyroid  bed within normal limits to palpation.  Parotid glands and submandibular glands equal bilaterally without mass.  Trachea is midline. Neuro:  CN 2-12 grossly intact.    Assessment: 1.  Bilateral chronic rhinosinusitis and polyposis.  The size of his nasal polyps has slightly increased since his last visit. 2.  Bilateral inferior turbinate hypertrophy.  Plan: 1.  The physical exam findings are reviewed with the patient. 2.  Continue with Flonase  nasal spray 2 sprays each nostril daily. 3.  High-dose prednisone  for 12 days. 4.  The patient will return for reevaluation in 3 months, sooner if needed. 5.  If he continues to have significant polyposis, he may need to undergo revision endoscopic sinus surgery.

## 2024-04-14 ENCOUNTER — Other Ambulatory Visit (HOSPITAL_COMMUNITY): Payer: Self-pay

## 2024-04-21 ENCOUNTER — Other Ambulatory Visit: Payer: Self-pay | Admitting: Family Medicine

## 2024-04-22 ENCOUNTER — Other Ambulatory Visit: Payer: Self-pay

## 2024-04-22 ENCOUNTER — Other Ambulatory Visit (HOSPITAL_COMMUNITY): Payer: Self-pay

## 2024-04-22 MED ORDER — MONTELUKAST SODIUM 10 MG PO TABS
10.0000 mg | ORAL_TABLET | Freq: Every day | ORAL | 3 refills | Status: DC
  Filled 2024-04-22: qty 30, 30d supply, fill #0
  Filled 2024-05-09 – 2024-05-17 (×2): qty 30, 30d supply, fill #1
  Filled 2024-06-24: qty 30, 30d supply, fill #2
  Filled 2024-07-27: qty 30, 30d supply, fill #3

## 2024-04-24 ENCOUNTER — Ambulatory Visit (INDEPENDENT_AMBULATORY_CARE_PROVIDER_SITE_OTHER): Payer: Self-pay

## 2024-04-24 DIAGNOSIS — R030 Elevated blood-pressure reading, without diagnosis of hypertension: Secondary | ICD-10-CM

## 2024-04-24 DIAGNOSIS — R002 Palpitations: Secondary | ICD-10-CM

## 2024-05-09 ENCOUNTER — Other Ambulatory Visit: Payer: Self-pay

## 2024-05-17 ENCOUNTER — Other Ambulatory Visit (HOSPITAL_COMMUNITY): Payer: Self-pay

## 2024-06-16 ENCOUNTER — Encounter (INDEPENDENT_AMBULATORY_CARE_PROVIDER_SITE_OTHER): Payer: Self-pay | Admitting: Otolaryngology

## 2024-06-16 ENCOUNTER — Ambulatory Visit (INDEPENDENT_AMBULATORY_CARE_PROVIDER_SITE_OTHER): Admitting: Otolaryngology

## 2024-06-16 VITALS — BP 147/86 | HR 78 | Temp 98.2°F

## 2024-06-16 DIAGNOSIS — J343 Hypertrophy of nasal turbinates: Secondary | ICD-10-CM | POA: Diagnosis not present

## 2024-06-16 DIAGNOSIS — J3489 Other specified disorders of nose and nasal sinuses: Secondary | ICD-10-CM

## 2024-06-16 DIAGNOSIS — R430 Anosmia: Secondary | ICD-10-CM

## 2024-06-16 DIAGNOSIS — J338 Other polyp of sinus: Secondary | ICD-10-CM | POA: Diagnosis not present

## 2024-06-16 DIAGNOSIS — J310 Chronic rhinitis: Secondary | ICD-10-CM | POA: Diagnosis not present

## 2024-06-16 DIAGNOSIS — J324 Chronic pansinusitis: Secondary | ICD-10-CM

## 2024-06-16 NOTE — Progress Notes (Signed)
 Patient ID: Thomas Gates, male   DOB: 1961-06-05, 63 y.o.   MRN: [id number]  Follow-up: Chronic nasal obstruction, bilateral nasal polyps, chronic rhinosinusitis   Discussed the use of AI scribe software for clinical note transcription with the patient, who gave verbal consent to proceed.  History of Present Illness Thomas Gates is a 63 year old male with chronic rhinosinusitis, chronic nasal obstruction, and bilateral sinonasal polyps who presents for follow-up evaluation.  He has a history of chronic rhinosinusitis, chronic nasal obstruction, and bilateral sinonasal polyps, having undergone sinus surgery in 2014. At his last visit in July 2025, he was noted to have bilateral recurrent sinonasal polyps and bilateral inferior turbinate hypertrophy. He was treated with Flonase  nasal spray, high dose prednisone , and nasal saline irrigation.  Over the past three months, his breathing has been 'so so' but shows significant improvement compared to three months ago. He experiences anosmia.  He experienced side effects from the steroid treatment, including increased appetite and weight gain, noting 'my shirt's tight'. He uses Flonase  nasal spray twice daily, once in the morning and once at night. He ran out of Flonase  briefly and noticed a difference in his symptoms, emphasizing the importance of daily use.  He has not experienced any sinus infections recently and is currently managing his symptoms with nasal spray.  Exam: General: Communicates without difficulty, well nourished, no acute distress. Head: Normocephalic, no evidence injury, no tenderness, facial buttresses intact without stepoff. Face/sinus: No tenderness to palpation and percussion. Facial movement is normal and symmetric. Eyes: PERRL, EOMI. No scleral icterus, conjunctivae clear. Neuro: CN II exam reveals vision grossly intact.  No nystagmus at any point of gaze. Ears: Auricles well formed without lesions.  Ear canals are  intact without mass or lesion.  No erythema or edema is appreciated.  The TMs are intact without fluid. Nose: External evaluation reveals normal support and skin without lesions.  Dorsum is intact.  Anterior rhinoscopy reveals congested mucosa over anterior aspect of inferior turbinates and intact septum.  Polypoid tissue noted bilaterally.  Oral:  Oral cavity and oropharynx are intact, symmetric, without erythema or edema.  Mucosa is moist without lesions. Neck: Full range of motion without pain.  There is no significant lymphadenopathy.  No masses palpable.  Thyroid  bed within normal limits to palpation.  Parotid glands and submandibular glands equal bilaterally without mass.  Trachea is midline. Neuro:  CN 2-12 grossly intact.   Assessment and Plan Assessment & Plan Chronic rhinosinusitis with recurrent bilateral nasal polyps Chronic rhinosinusitis with recurrent bilateral nasal polyps, improved with high dose prednisone  and Flonase . 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 Responsive to steroids, indicating potential for medical management. Surgery considered but deferred due to patient's current work schedule and preference for non-surgical management. - Continue Flonase  2 sprays nasal daily - Consider Dupixent if symptoms worsen and insurance coverage is confirmed - Consider surgical intervention if symptoms become intolerable and schedule permits - Prescribe high dose prednisone  as needed for exacerbations - Educate on the importance of daily Flonase  use - Discuss Dupixent, including administration and cost - Monitor for sinus infections and contact provider if he occurs  Bilateral inferior turbinate hypertrophy Bilateral inferior turbinate hypertrophy contributing to nasal obstruction. Previous treatment with Flonase  and prednisone  has improved symptoms. Surgical intervention discussed but deferred due to current improvement and  patient's schedule. - Continue Flonase  2 sprays nasal daily - Consider surgical intervention if symptoms  worsen and schedule permits

## 2024-06-24 ENCOUNTER — Other Ambulatory Visit (HOSPITAL_COMMUNITY): Payer: Self-pay

## 2024-06-25 ENCOUNTER — Other Ambulatory Visit (HOSPITAL_COMMUNITY): Payer: Self-pay

## 2024-07-28 ENCOUNTER — Other Ambulatory Visit (HOSPITAL_COMMUNITY): Payer: Self-pay

## 2024-08-23 ENCOUNTER — Other Ambulatory Visit: Payer: Self-pay | Admitting: Family Medicine

## 2024-08-25 ENCOUNTER — Other Ambulatory Visit (HOSPITAL_COMMUNITY): Payer: Self-pay

## 2024-08-25 MED ORDER — MONTELUKAST SODIUM 10 MG PO TABS
10.0000 mg | ORAL_TABLET | Freq: Every day | ORAL | 3 refills | Status: AC
  Filled 2024-08-25: qty 30, 30d supply, fill #0
  Filled 2024-09-15: qty 30, 30d supply, fill #1
  Filled 2024-10-07: qty 90, 90d supply, fill #1

## 2024-09-15 ENCOUNTER — Encounter (INDEPENDENT_AMBULATORY_CARE_PROVIDER_SITE_OTHER): Payer: Self-pay | Admitting: Otolaryngology

## 2024-09-15 ENCOUNTER — Ambulatory Visit (INDEPENDENT_AMBULATORY_CARE_PROVIDER_SITE_OTHER): Admitting: Otolaryngology

## 2024-09-15 ENCOUNTER — Other Ambulatory Visit: Payer: Self-pay

## 2024-09-15 ENCOUNTER — Other Ambulatory Visit (HOSPITAL_COMMUNITY): Payer: Self-pay

## 2024-09-15 ENCOUNTER — Encounter (HOSPITAL_COMMUNITY): Payer: Self-pay

## 2024-09-15 VITALS — BP 146/86 | HR 76

## 2024-09-15 DIAGNOSIS — R430 Anosmia: Secondary | ICD-10-CM | POA: Diagnosis not present

## 2024-09-15 DIAGNOSIS — J339 Nasal polyp, unspecified: Secondary | ICD-10-CM | POA: Diagnosis not present

## 2024-09-15 DIAGNOSIS — J324 Chronic pansinusitis: Secondary | ICD-10-CM

## 2024-09-15 DIAGNOSIS — J329 Chronic sinusitis, unspecified: Secondary | ICD-10-CM | POA: Diagnosis not present

## 2024-09-15 DIAGNOSIS — J338 Other polyp of sinus: Secondary | ICD-10-CM

## 2024-09-15 DIAGNOSIS — J343 Hypertrophy of nasal turbinates: Secondary | ICD-10-CM | POA: Diagnosis not present

## 2024-09-15 MED ORDER — PREDNISONE 10 MG (21) PO TBPK
ORAL_TABLET | ORAL | 0 refills | Status: AC
  Filled 2024-09-15: qty 21, 6d supply, fill #0

## 2024-09-15 NOTE — Progress Notes (Signed)
 Patient ID: Thomas Gates, male   DOB: 01/25/1961, 64 y.o.   MRN: [id number]  Follow up: Chronic rhinosinusitis, sinonasal polyps  History of Present Illness Thomas Gates is a 64 year old male with chronic rhinosinusitis and recurrent nasal polyps status post prior sinus surgery who presents for follow-up of persistent sinonasal symptoms and anosmia.  Over the past three months, he has experienced fluctuating sinonasal symptoms with intermittent improvement and worsening. He denies recent acute sinus infections. He continues to have complete anosmia without improvement.  He uses a steroid nasal spray (Flonase ) twice daily. The patient reports that his nasal symptoms are worse at night and that he has difficulty sleeping. He reports recent headaches that are more severe than previously, but denies facial pain or tenderness.  He notes increased nasal irritation following dust exposure, with transient worsening of symptoms. He underwent sinus surgery thirteen years ago, which provided significant relief until recurrence of symptoms over the past year.  Exam: General: Communicates without difficulty, well nourished, no acute distress. Head: Normocephalic, no evidence injury, no tenderness, facial buttresses intact without stepoff. Face/sinus: No tenderness to palpation and percussion. Facial movement is normal and symmetric. Eyes: PERRL, EOMI. No scleral icterus, conjunctivae clear. Neuro: CN II exam reveals vision grossly intact.  No nystagmus at any point of gaze. Ears: Auricles well formed without lesions.  Ear canals are intact without mass or lesion.  No erythema or edema is appreciated.  The TMs are intact without fluid. Nose: External evaluation reveals normal support and skin without lesions.  Dorsum is intact.  Anterior rhinoscopy reveals congested mucosa over anterior aspect of inferior turbinates and intact septum.  Polypoid tissue is noted bilaterally.  Oral:  Oral cavity and  oropharynx are intact, symmetric, without erythema or edema.  Mucosa is moist without lesions. Neck: Full range of motion without pain.  There is no significant lymphadenopathy.  No masses palpable.  Thyroid  bed within normal limits to palpation.  Parotid glands and submandibular glands equal bilaterally without mass.  Trachea is midline. Neuro:  CN 2-12 grossly intact.    Assessment and Plan Assessment & Plan Chronic rhinosinusitis with recurrent bilateral nasal polyps Chronic, recurrent rhinosinusitis with persistent anosmia, intermittent nasal obstruction worsening nocturnally, and increased headaches without evidence of acute infection. Examination demonstrated ongoing polyp burden, possibly slightly increased compared to prior visit. Previous sinus surgery provided long-term relief, indicating slow progression. Discussed medical management, biologic therapy, and surgical options extensively. - Continued Flonase  (fluticasone ) 2 sprays intranasally daily. - Prescribed a 6-day oral prednisone  taper. - Ordered CT scan of the sinuses. - Discussed dupilumab (Dupixent) injections as long-term therapy, including efficacy for polyp reduction, need for ongoing injections, and cost considerations. - Discussed surgical intervention as an option if symptoms worsen or are refractory to medical therapy. - Scheduled follow-up in approximately one month to review CT results and response to prednisone .  Bilateral inferior turbinate hypertrophy - Continued Flonase  (fluticasone ) 2 sprays intranasally daily.

## 2024-10-07 ENCOUNTER — Other Ambulatory Visit (HOSPITAL_COMMUNITY): Payer: Self-pay

## 2024-10-21 ENCOUNTER — Ambulatory Visit (INDEPENDENT_AMBULATORY_CARE_PROVIDER_SITE_OTHER): Admitting: Otolaryngology
